# Patient Record
Sex: Female | Born: 2000 | Race: Black or African American | Hispanic: No | Marital: Single | Smoking: Former smoker
Health system: Southern US, Community
[De-identification: ages and names within clinical notes are randomized; demographics above are authoritative.]

## PROBLEM LIST (undated history)

## (undated) ENCOUNTER — Inpatient Hospital Stay (HOSPITAL_COMMUNITY): Payer: Self-pay

## (undated) DIAGNOSIS — F410 Panic disorder [episodic paroxysmal anxiety] without agoraphobia: Secondary | ICD-10-CM

## (undated) DIAGNOSIS — J45909 Unspecified asthma, uncomplicated: Secondary | ICD-10-CM

## (undated) HISTORY — PX: NO PAST SURGERIES: SHX2092

---
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
## 2018-10-07 ENCOUNTER — Emergency Department (HOSPITAL_COMMUNITY)
Admission: EM | Admit: 2018-10-07 | Discharge: 2018-10-07 | Disposition: A | Discharge status: Home / Self Care | Payer: Self-pay | Attending: Emergency Medicine | Admitting: Emergency Medicine

## 2018-10-07 ENCOUNTER — Encounter (HOSPITAL_COMMUNITY): Payer: Self-pay | Admitting: Emergency Medicine

## 2018-10-07 DIAGNOSIS — R195 Other fecal abnormalities: Secondary | ICD-10-CM | POA: Insufficient documentation

## 2018-10-07 DIAGNOSIS — M546 Pain in thoracic spine: Secondary | ICD-10-CM | POA: Insufficient documentation

## 2018-10-07 DIAGNOSIS — M549 Dorsalgia, unspecified: Secondary | ICD-10-CM

## 2018-10-07 DIAGNOSIS — R1084 Generalized abdominal pain: Secondary | ICD-10-CM | POA: Insufficient documentation

## 2018-10-07 DIAGNOSIS — M545 Low back pain: Secondary | ICD-10-CM | POA: Insufficient documentation

## 2018-10-07 LAB — COMPREHENSIVE METABOLIC PANEL
ALBUMIN: 4 g/dL (ref 3.5–5.0)
ALT: 14 U/L (ref 0–44)
AST: 21 U/L (ref 15–41)
Alkaline Phosphatase: 38 U/L (ref 38–126)
Anion gap: 7 (ref 5–15)
BILIRUBIN TOTAL: 0.4 mg/dL (ref 0.3–1.2)
BUN: 12 mg/dL (ref 6–20)
CO2: 24 mmol/L (ref 22–32)
CREATININE: 0.63 mg/dL (ref 0.44–1.00)
Calcium: 8.8 mg/dL — ABNORMAL LOW (ref 8.9–10.3)
Chloride: 107 mmol/L (ref 98–111)
GFR calc Af Amer: >60 mL/min (ref >60)
GLUCOSE: 95 mg/dL (ref 70–99)
Potassium: 4 mmol/L (ref 3.5–5.1)
Sodium: 138 mmol/L (ref 135–145)
TOTAL PROTEIN: 7.1 g/dL (ref 6.5–8.1)

## 2018-10-07 LAB — CBC
HEMATOCRIT: 40.5 % (ref 36.0–46.0)
Hemoglobin: 13.4 g/dL (ref 12.0–15.0)
MCH: 31 pg (ref 26.0–34.0)
MCHC: 33.1 g/dL (ref 30.0–36.0)
MCV: 93.8 fL (ref 80.0–100.0)
Platelets: 306 10*3/uL (ref 150–400)
RBC: 4.32 MIL/uL (ref 3.87–5.11)
RDW: 12.5 % (ref 11.5–15.5)
WBC: 5.6 10*3/uL (ref 4.0–10.5)
nRBC: 0 % (ref 0.0–0.2)

## 2018-10-07 LAB — URINALYSIS, ROUTINE W REFLEX MICROSCOPIC
BILIRUBIN URINE: NEGATIVE
Glucose, UA: NEGATIVE mg/dL
HGB URINE DIPSTICK: NEGATIVE
KETONES UR: NEGATIVE mg/dL
Leukocytes, UA: NEGATIVE
Nitrite: NEGATIVE
PH: 6 (ref 5.0–8.0)
Protein, ur: NEGATIVE mg/dL
SPECIFIC GRAVITY, URINE: 1.011 (ref 1.005–1.030)

## 2018-10-07 LAB — I-STAT BETA HCG BLOOD, ED (MC, WL, AP ONLY): I-stat hCG, quantitative: <5 m[IU]/mL (ref <5)

## 2018-10-07 LAB — LIPASE, BLOOD: Lipase: 41 U/L (ref 11–51)

## 2018-10-07 MED ORDER — ACETAMINOPHEN 325 MG PO TABS
650.0000 mg | ORAL_TABLET | Freq: Once | ORAL | Status: AC
  Administered 2018-10-07: 650 mg via ORAL
  Filled 2018-10-07: qty 2

## 2018-10-07 MED ORDER — ALUM & MAG HYDROXIDE-SIMETH 200-200-20 MG/5ML PO SUSP
30.0000 mL | Freq: Once | ORAL | Status: AC
Start: 2018-10-07 — End: 2018-10-07
  Administered 2018-10-07: 30 mL via ORAL
  Filled 2018-10-07: qty 30

## 2018-10-07 NOTE — ED Triage Notes (Signed)
Pt c/o mid to lower back pain for about 3 weeks. 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  Pt also c/o abd pains for the past week.

## 2018-10-07 NOTE — ED Provider Notes (Signed)
[facility] DEPT Provider Note   CSN: [id number] Arrival date & time: 10/07/18  1302     History   Chief Complaint Chief Complaint  Patient presents with  . back pain  . Abdominal Pain  . Diarrhea    HPI Carolyn Vaughn is a 17 y.o. female who presents with cc of back and abdominal pain.  1) the patient states that 3 weeks ago she began having upper and lower back pain. The pain is worse when she is lying down and she has had significant discomfort at night and difficulty sleeping. It is not worsened with movement. She feels better when she is up and moving around at work.  The pain does not radiate. She has not tried anything to improve her pain. She recently went on a car trip to [employer] weekend and felt that her back was much worse after sitting in the car for several hours.  2) The patient has had 5 days of abdominal pain. The pain is in the upper and lower abdomen. She states that at times her pain feels like her stomach is being twisted.  It does not radiate.  It comes in waves.  She states that during the day she does not seem to notice it as much however recently over the past 2 days she has had worsening epigastric pain.  She is also noticed she is had some gas, belching and in the morning has flatulence and several loose stools before she can get out the door.  She states that it is very abnormal for her.  She denies fevers, chills, urinary symptoms.  She has no previous history of abdominal surgeries.  She denies any change in her appetite.  Her abdominal pain is worse in the morning and at night when she is not working.  She denies polyuria, polydipsia, polyphagia.  She denies fevers, unexplained weight loss or family history of inflammatory bowel disease.  HPI  History reviewed. No pertinent past medical history.  Patient Active Problem List   Diagnosis Date Noted  . Chlamydia infection affecting pregnancy 01/08/2017    Past  Surgical History:  Procedure Laterality Date  . NO PAST SURGERIES       OB History    Gravida  1   Para      Term      Preterm      AB  1   Living        SAB      TAB  1   Ectopic      Multiple      Live Births               Home Medications    Prior to Admission medications   Medication Sig Start Date End Date Taking? Authorizing Provider  ibuprofen (ADVIL,MOTRIN) 600 MG tablet Take 1 tablet (600 mg total) by mouth every 6 (six) hours as needed. Patient not taking: Reported on 10/07/2018 06/18/17   Ward, Ozella Almond, PA-C    Family History Family History  Problem Relation Age of Onset  . Diabetes Maternal Grandmother     Social History Social History   Tobacco Use  . Smoking status: Never Smoker  . Smokeless tobacco: Never Used  Substance Use Topics  . Alcohol use: No  . Drug use: No     Allergies   Patient has no known allergies.   Review of Systems Review of Systems Ten systems reviewed and are negative for acute  change, except as noted in the HPI.    Physical Exam Updated Vital Signs BP 112/89 (BP Location: Left Arm)   Pulse 87   Temp 98.7 F (37.1 C) (Oral)   Resp 17   LMP 09/13/2018   SpO2 100%   Physical Exam  Constitutional: She is oriented to person, place, and time. She appears well-developed and well-nourished. No distress.  HENT:  Head: Normocephalic and atraumatic.  Eyes: Pupils are equal, round, and reactive to light. Conjunctivae and EOM are normal. No scleral icterus.  Neck: Normal range of motion.  Cardiovascular: Normal rate, regular rhythm and normal heart sounds. Exam reveals no gallop and no friction rub.  No murmur heard. Pulmonary/Chest: Effort normal and breath sounds normal. No respiratory distress.  Abdominal: Soft. Bowel sounds are normal. She exhibits no distension and no mass. There is generalized tenderness. There is no guarding.  Musculoskeletal:       Cervical back: She exhibits tenderness. She  exhibits normal range of motion.       Thoracic back: Normal. She exhibits normal range of motion.       Lumbar back: Normal.  Neurological: She is alert and oriented to person, place, and time.  Skin: Skin is warm and dry. She is not diaphoretic.  Psychiatric: Her behavior is normal.  Nursing note and vitals reviewed.    ED Treatments / Results  Labs (all labs ordered are listed, but only abnormal results are displayed) Labs Reviewed  LIPASE, BLOOD  COMPREHENSIVE METABOLIC PANEL  CBC  URINALYSIS, ROUTINE W REFLEX MICROSCOPIC  I-STAT BETA HCG BLOOD, ED ([facility], [facility], [facility] ONLY)    EKG None  Radiology No results found.  Procedures Procedures (including critical care time)  Medications Ordered in ED Medications - No data to display   Initial Impression / Assessment and Plan / ED Course  I have reviewed the triage vital signs and the nursing notes.  Pertinent labs & imaging results that were available during my care of the patient were reviewed by me and considered in my medical decision making (see chart for details).  Clinical Course as of Oct 07 1413  Fri Oct 07, 2018  1410 I-stat hCG, quantitative: <5.0 [AH]    Clinical Course User Index [AH] Margarita Mail, PA-C    This is a 17 year old female with what appears to be musculoskeletal back pain, and abdominal pain.  Personally reviewed the patient's labs which show no acute abnormalities.  She does not have an elevated white blood cell count.  She has normal liver enzymes.  Her kidney function appears normal.  Her lipase is not elevated.  She does not appear to have a urinary tract infection.  The patient has not taken anything for her symptoms.  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  She is of small stature and states that she frequently has a step on her tiptoes to reach objects way above her head and I suspect that her back pain is secondary to using muscle she has not had to use to stabilize herself in a  long time.  Also her pain appears to be improved when she is moving.  Thinks is secondary to the endorphin release during activity.  Patient will should be treated with Tylenol.  She has no red flag symptoms including weakness, numbness, saddle anesthesia, bowel or bladder incontinence.  Patient also has abdominal discomfort, gas and loose stools.  Feel this may be treated with over-the-counter dyspeptic agents such as Maalox, simethicone  or Pepcid.  She may also try Pepto-Bismol which should cover the majority of those symptoms.  Patient does not have any concern for infectious or inflammatory loose stools, she does not appear to have any melena per history.  Feel she is appropriate for discharge at this time with stable vital signs.  She is afebrile.  I discussed outpatient follow-up and return precautions.  Final Clinical Impressions(s) / ED Diagnoses   Final diagnoses:  None    ED Discharge Orders    None       Margarita Mail, PA-C 10/07/18 2138    Valarie Merino, MD 10/20/18 [id number]

## 2018-10-07 NOTE — Discharge Instructions (Addendum)
Your lab work is negative for any abnormality today.  It does not appear that there is an emergent cause of your back or belly pain. Please take tylenol or advil if you are having discomfort, especially at night if you are having difficulty sleeping.  This is likely secondary to your new job, standing and reaching up overhead frequently.  Musculoskeletal pain is generally improved when you are up and working because your body releases endorphins with movement which is your body's way of treating pain.  Is also likely more noticeable at night because you are up and moving.  Secondarily if you are continue to have loose stools or belly pain I would suggest using Pepto-Bismol once a day until your stools firm up.  Noticed that he should wash her mouth out or pressure tongue because he can turn both your tongue and your bowel movements black.  Also you may take Maalox or simethicone which is available over-the-counter for gas.  You are continuing to have symptoms that are uncontrolled with your medications, change in your diet and follow-up with a primary care physician as soon as possible.\  Return to the Emergency department for the reasons listed below: New numbness, tingling, weakness, or problem with the use of your arms or legs.  Severe back pain not relieved with medications.  Change in bowel or bladder control.  Increasing pain in any areas of the body (such as chest or abdominal pain).  Shortness of breath, dizziness or fainting.  Nausea (feeling sick to your stomach), vomiting, fever, or sweats. Your pain becomes severe.  You have a fever  above 101 develops.  Repeated vomiting occurs (multiple episodes).  The pain becomes localized to portions of the abdomen. The right side could possibly be appendicitis. In an adult, the left lower portion of the abdomen could be colitis or diverticulitis.  Blood is being passed in stools or vomit (bright red or black tarry stools).  Return also if you  develop chest pain, difficulty breathing, dizziness or fainting, or become confused, poorly responsive, or inconsolable (young children).

## 2018-11-16 NOTE — L&D Delivery Note (Addendum)
OB/GYN Faculty Practice Delivery Note  Carolyn Vaughn is a 18 y.o. G2P0010 s/p SVD at [redacted]w[redacted]d. She was admitted for IOL secondary gestational HTN.   ROM: 0h 61m with clear fluid GBS Status:  Negative (08/11 0910) Maximum Maternal Temperature: 36.8C  Labor Progress: . Patient presented to L&D for Pt presented to MAU for evaluation of LOF; amnisure & fern were negative. While in MAU had consistently elevated BPs (139-154/89-106). No history of hypertension. Denies headache, visual disturbance, or epigastric pain. PEC labs negative. Initial SVE: 1/80%/0. Patient was started on Pitocin, received epidural and had SROM. She then progressed to complete.   Delivery Date/Time: 07/05/2019 1709 Weight: 2740 g Delivery: Called to room and patient was complete and pushing. Head delivered in ROA position. Nuchal cord present and reduced. Shoulder and body delivered in usual fashion. Infant with spontaneous cry, placed on mother's abdomen, dried and stimulated. Cord clamped x 2 after 1-minute delay, and cut by Orland Penman (PA student). Cord blood drawn. Placenta delivered spontaneously with gentle cord traction. Fundus firm with massage and Pitocin. Labia, perineum, vagina, and cervix inspected inspected with no lacerations.   Placenta: sent to L&D Complications: none Lacerations: none EBL: 350 mL Analgesia: Epidural    Postpartum Planning [ ]  message to sent to schedule follow-up  [ ]  vaccines UTD  Infant: APGAR (1 MIN):   APGAR (5 MINS): 9   APGAR (10 MINS):     Barrington Ellison, MD [facility] Family Medicine Fellow, [facility] for [facility], [facility] 07/05/2019, 6:46 PM

## 2018-11-22 ENCOUNTER — Other Ambulatory Visit: Payer: Self-pay

## 2018-11-22 ENCOUNTER — Emergency Department (HOSPITAL_COMMUNITY)
Admission: EM | Admit: 2018-11-22 | Discharge: 2018-11-22 | Disposition: A | Discharge status: Home / Self Care | Payer: Medicaid Other | Attending: Emergency Medicine | Admitting: Emergency Medicine

## 2018-11-22 ENCOUNTER — Encounter (HOSPITAL_COMMUNITY): Payer: Self-pay

## 2018-11-22 DIAGNOSIS — J45901 Unspecified asthma with (acute) exacerbation: Secondary | ICD-10-CM | POA: Insufficient documentation

## 2018-11-22 MED ORDER — PREDNISONE 20 MG PO TABS
60.0000 mg | ORAL_TABLET | Freq: Once | ORAL | Status: AC
  Administered 2018-11-22: 60 mg via ORAL
  Filled 2018-11-22: qty 3

## 2018-11-22 MED ORDER — ALBUTEROL SULFATE (2.5 MG/3ML) 0.083% IN NEBU
5.0000 mg | INHALATION_SOLUTION | Freq: Once | RESPIRATORY_TRACT | Status: AC
  Administered 2018-11-22: 5 mg via RESPIRATORY_TRACT
  Filled 2018-11-22: qty 6

## 2018-11-22 MED ORDER — ALBUTEROL SULFATE HFA 108 (90 BASE) MCG/ACT IN AERS
2.0000 | INHALATION_SPRAY | Freq: Once | RESPIRATORY_TRACT | Status: AC
  Administered 2018-11-22: 2 via RESPIRATORY_TRACT
  Filled 2018-11-22: qty 6.7

## 2018-11-22 NOTE — ED Provider Notes (Signed)
[facility] DEPT Provider Note   CSN: [id number] Arrival date & time: 11/22/18  1235     History   Chief Complaint Chief Complaint  Patient presents with  . Asthma    HPI Carolyn Vaughn is a 18 y.o. female with past medical history of asthma, presenting to the emergency department after asthma attack.  Patient states she was riding in the car and having a conversation, when she had a sudden asthma attack.  She states she did not have her an inhaler with her, and had a drive to get it, about 8 minutes.  Symptoms improved significantly with albuterol, however patient has a mild lingering headache and some chest wall soreness following the incident.  Pt is out of her albuterol inhaler.  No cold symptoms.  No other complaints.  The history is provided by the patient.    Past Medical History:  Diagnosis Date  . Asthma     Patient Active Problem List   Diagnosis Date Noted  . Chlamydia infection affecting pregnancy 01/08/2017    Past Surgical History:  Procedure Laterality Date  . NO PAST SURGERIES       OB History    Gravida  1   Para      Term      Preterm      AB  1   Living        SAB      TAB  1   Ectopic      Multiple      Live Births               Home Medications    Prior to Admission medications   Medication Sig Start Date End Date Taking? Authorizing Provider  ibuprofen (ADVIL,MOTRIN) 600 MG tablet Take 1 tablet (600 mg total) by mouth every 6 (six) hours as needed. Patient not taking: Reported on 10/07/2018 06/18/17   Ward, Ozella Almond, PA-C    Family History Family History  Problem Relation Age of Onset  . Diabetes Maternal Grandmother   . Emphysema Mother     Social History Social History   Tobacco Use  . Smoking status: Current Every Day Smoker    Types: Cigars  . Smokeless tobacco: Never Used  Substance Use Topics  . Alcohol use: No  . Drug use: No     Allergies   Patient  has no known allergies.   Review of Systems Review of Systems  Constitutional: Negative for fever.  HENT: Negative for congestion and sore throat.   Respiratory: Positive for chest tightness and shortness of breath.      Physical Exam Updated Vital Signs BP 115/72 (BP Location: Right Arm)   Pulse 88   Temp 98.6 F (37 C) (Oral)   Resp 18   Ht 5\' 1"  (1.549 m)   Wt 45.4 kg   SpO2 100%   BMI 18.89 kg/m   Physical Exam Vitals signs and nursing note reviewed.  Constitutional:      General: She is not in acute distress.    Appearance: She is well-developed. She is not ill-appearing.  HENT:     Head: Normocephalic and atraumatic.     Mouth/Throat:     Mouth: Mucous membranes are moist.     Pharynx: Oropharynx is clear.  Eyes:     Conjunctiva/sclera: Conjunctivae normal.  Neck:     Musculoskeletal: Normal range of motion and neck supple.  Cardiovascular:     Rate and Rhythm:  Normal rate and regular rhythm.  Pulmonary:     Effort: Pulmonary effort is normal. No respiratory distress.     Breath sounds: Normal breath sounds. No wheezing.  Neurological:     Mental Status: She is alert.  Psychiatric:        Mood and Affect: Mood normal.        Behavior: Behavior normal.      ED Treatments / Results  Labs (all labs ordered are listed, but only abnormal results are displayed) Labs Reviewed - No data to display  EKG None  Radiology No results found.  Procedures Procedures (including critical care time)  Medications Ordered in ED Medications  predniSONE (DELTASONE) tablet 60 mg (60 mg Oral Given 11/22/18 1434)  albuterol (PROVENTIL) (2.5 MG/3ML) 0.083% nebulizer solution 5 mg (5 mg Nebulization Given 11/22/18 1416)  albuterol (PROVENTIL HFA;VENTOLIN HFA) 108 (90 Base) MCG/ACT inhaler 2 puff (2 puffs Inhalation Given 11/22/18 1417)     Initial Impression / Assessment and Plan / ED Course  I have reviewed the triage vital signs and the nursing notes.  Pertinent  labs & imaging results that were available during my care of the patient were reviewed by me and considered in my medical decision making (see chart for details).     Patient presenting after asthma attack, with symptoms significantly improved on arrival after home albuterol inhaler.  No current signs of respiratory distress. Exam is reassuring, lungs CTAB, however pt requesting neb treatment. Pt states they are breathing at baseline prior to discharge. Pt has been instructed to continue using prescribed medications and to speak with PCP about today's visit.   Discussed results, findings, treatment and follow up. Patient advised of return precautions. Patient verbalized understanding and agreed with plan.   Final Clinical Impressions(s) / ED Diagnoses   Final diagnoses:  Mild asthma with exacerbation, unspecified whether persistent    ED Discharge Orders    None       Robinson, [place] N, PA-C 11/22/18 1530    Blanchie Dessert, MD 11/22/18 1559

## 2018-11-22 NOTE — ED Triage Notes (Addendum)
Patient reports that she began having an asthma attack while riding in the car and did not have her albuterol inhaler with her and when she returned home she used it and states she is feeling much better. patient also c/o headache and left rib cage pain. Lungs clear and Sats 100% in triage.

## 2018-11-22 NOTE — Discharge Instructions (Signed)
Please read instructions below. Use your albuterol inhaler every 4-6 hours as needed for shortness of breath or wheezing.  Follow up with your primary care provider. Return to the ER if you have shortness of breath not improved by your inhaler, or new or concerning symptoms.

## 2018-12-04 ENCOUNTER — Inpatient Hospital Stay (HOSPITAL_COMMUNITY)
Admission: AD | Admit: 2018-12-04 | Discharge: 2018-12-04 | Discharge status: Against Medical Advice | Payer: Medicaid Other | Source: Ambulatory Visit | Attending: Obstetrics & Gynecology | Admitting: Obstetrics & Gynecology

## 2018-12-04 NOTE — MAU Note (Signed)
Pt came up to the desk upset that she hasn't been seen yet. Here for pregnancy test, denies bleeding and abdominal pain. Provider told patient that she could make an appointment at the clinic in the morning or walk in to be seen after 8 am tomorrow or she could continue to wait to be seen. She will come the the office in the morning.

## 2019-01-11 ENCOUNTER — Encounter (HOSPITAL_COMMUNITY): Payer: Self-pay | Admitting: *Deleted

## 2019-01-11 ENCOUNTER — Inpatient Hospital Stay (HOSPITAL_COMMUNITY)
Admission: AD | Admit: 2019-01-11 | Discharge: 2019-01-11 | Disposition: A | Discharge status: Home / Self Care | Payer: Medicaid Other | Attending: Obstetrics & Gynecology | Admitting: Obstetrics & Gynecology

## 2019-01-11 DIAGNOSIS — Z836 Family history of other diseases of the respiratory system: Secondary | ICD-10-CM | POA: Diagnosis not present

## 2019-01-11 DIAGNOSIS — Z87891 Personal history of nicotine dependence: Secondary | ICD-10-CM | POA: Insufficient documentation

## 2019-01-11 DIAGNOSIS — O99511 Diseases of the respiratory system complicating pregnancy, first trimester: Secondary | ICD-10-CM | POA: Diagnosis not present

## 2019-01-11 DIAGNOSIS — W19XXXA Unspecified fall, initial encounter: Secondary | ICD-10-CM | POA: Diagnosis not present

## 2019-01-11 DIAGNOSIS — Z833 Family history of diabetes mellitus: Secondary | ICD-10-CM | POA: Insufficient documentation

## 2019-01-11 DIAGNOSIS — O9989 Other specified diseases and conditions complicating pregnancy, childbirth and the puerperium: Secondary | ICD-10-CM | POA: Diagnosis not present

## 2019-01-11 DIAGNOSIS — H9209 Otalgia, unspecified ear: Secondary | ICD-10-CM | POA: Diagnosis not present

## 2019-01-11 DIAGNOSIS — M542 Cervicalgia: Secondary | ICD-10-CM | POA: Diagnosis present

## 2019-01-11 DIAGNOSIS — M549 Dorsalgia, unspecified: Secondary | ICD-10-CM | POA: Insufficient documentation

## 2019-01-11 DIAGNOSIS — W540XXA Bitten by dog, initial encounter: Secondary | ICD-10-CM | POA: Diagnosis not present

## 2019-01-11 DIAGNOSIS — Z3A13 13 weeks gestation of pregnancy: Secondary | ICD-10-CM | POA: Insufficient documentation

## 2019-01-11 DIAGNOSIS — J45909 Unspecified asthma, uncomplicated: Secondary | ICD-10-CM | POA: Insufficient documentation

## 2019-01-11 LAB — URINALYSIS, ROUTINE W REFLEX MICROSCOPIC
Bilirubin Urine: NEGATIVE
Glucose, UA: NEGATIVE mg/dL
Hgb urine dipstick: NEGATIVE
Ketones, ur: NEGATIVE mg/dL
Leukocytes,Ua: NEGATIVE
NITRITE: NEGATIVE
PH: 8 (ref 5.0–8.0)
Protein, ur: NEGATIVE mg/dL
Specific Gravity, Urine: 1.011 (ref 1.005–1.030)

## 2019-01-11 NOTE — Progress Notes (Signed)
Pt instructed by H. Norman Herrlich, CNM she should be seen in [facility] ED for dog attack d/t complaint not pregnancy related.  Pt has decided she prefers to leave.  Informed she would be leaving AMA since decliningto be seen in [facility] ED.  Pt verbalized understanding, will leave AMA.

## 2019-01-11 NOTE — MAU Note (Signed)
Pt had dog attack on Saturday, was bitten on neck.  Has neck & ear pain, also back pain (entire back).  Has had lower abdominal cramping since Monday, denies bleeding.

## 2019-01-11 NOTE — MAU Provider Note (Signed)
History     CSN: [id number]  Arrival date and time: 01/11/19 1027   First Provider Initiated Contact with Patient 01/11/19 1637      Chief Complaint  Patient presents with  . Neck Pain  . Otalgia  . Back Pain   Carolyn Vaughn is a 18 y.o. G2P0010 at [redacted]w[redacted]d who presents today with back pain and neck pain. She states that on 01/07/19 her mother's dog jumped on her, scratched her neck, pushed her down and she fell onto her back. She states that since that time she has had back pain and neck pain. She reports that she has been taking ibuprofen for this, but it has not helped. She denies any OB related complaint, denies VB, vaginal discharge or cramping. 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 She thinks that she is working too much, and this is contributing to her back pain.    OB History    Gravida  2   Para      Term      Preterm      AB  1   Living        SAB      TAB  1   Ectopic      Multiple      Live Births              Past Medical History:  Diagnosis Date  . Asthma     Past Surgical History:  Procedure Laterality Date  . NO PAST SURGERIES      Family History  Problem Relation Age of Onset  . Diabetes Maternal Grandmother   . Hyperlipidemia Maternal Grandmother   . Emphysema Mother   . Diabetes Brother   . Hyperlipidemia Maternal Aunt   . Diabetes Maternal Aunt   . Diabetes Paternal Grandmother     Social History   Tobacco Use  . Smoking status: Former Smoker    Types: Cigars    Last attempt to quit: 12/05/2018    Years since quitting: 0.1  . Smokeless tobacco: Never Used  Substance Use Topics  . Alcohol use: No  . Drug use: No    Allergies: No Known Allergies  Medications Prior to Admission  Medication Sig Dispense Refill Last Dose  . ibuprofen (ADVIL,MOTRIN) 600 MG tablet Take 1 tablet (600 mg total) by mouth every 6 (six) hours as needed. (Patient not taking: Reported on 10/07/2018) 30 tablet 0 Not Taking at  Unknown time    Review of Systems  Constitutional: Negative for chills and fever.  Gastrointestinal: Negative for abdominal pain.  Genitourinary: Negative for pelvic pain, vaginal bleeding and vaginal discharge.  Musculoskeletal: Positive for back pain and neck pain.   Physical Exam   Blood pressure 114/62, pulse 89, temperature 98.2 F (36.8 C), temperature source Oral, resp. rate 16, height 5\' 1"  (1.549 m), weight 45.3 kg, last menstrual period 10/09/2018, unknown if currently breastfeeding.  Physical Exam  Nursing note and vitals reviewed. Constitutional: She is oriented to person, place, and time. She appears well-developed and well-nourished. No distress.  HENT:  Head: Normocephalic.  Cardiovascular: Normal rate.  GI: Soft. There is no abdominal tenderness. There is no rebound.  Neurological: She is alert and oriented to person, place, and time.  Skin: Skin is warm and dry.  Psychiatric: She has a normal mood and affect.     +FHT 158 with doppler   MAU Course  Procedures  MDM Patient is cleared obstetrically, and no obstetrical  emergency present at this time. Due to fall and dog bite will transfer patient to the ED for medical evaluation.    Patient refuses transfer to ED. Will sign out AMA  Assessment and Plan   1. Fall, initial encounter   2. Dog bite, initial encounter   3. [redacted] weeks gestation of pregnancy    Transfer to the ED for medical evaluation  Patient signed out [place], CNM  01/11/19  4:46 PM

## 2019-01-11 NOTE — Progress Notes (Signed)
Pt left AMA °

## 2019-02-08 ENCOUNTER — Other Ambulatory Visit: Payer: Self-pay

## 2019-02-08 ENCOUNTER — Encounter: Payer: Self-pay | Admitting: Obstetrics

## 2019-02-08 ENCOUNTER — Ambulatory Visit (INDEPENDENT_AMBULATORY_CARE_PROVIDER_SITE_OTHER): Payer: Medicaid Other | Admitting: Obstetrics

## 2019-02-08 ENCOUNTER — Other Ambulatory Visit (HOSPITAL_COMMUNITY)
Admission: RE | Admit: 2019-02-08 | Discharge: 2019-02-08 | Disposition: A | Discharge status: Home / Self Care | Payer: Medicaid Other | Source: Ambulatory Visit | Attending: Obstetrics | Admitting: Obstetrics

## 2019-02-08 VITALS — BP 106/71 | HR 92 | Wt 107.0 lb

## 2019-02-08 DIAGNOSIS — Z3480 Encounter for supervision of other normal pregnancy, unspecified trimester: Secondary | ICD-10-CM | POA: Diagnosis present

## 2019-02-08 DIAGNOSIS — Z3A17 17 weeks gestation of pregnancy: Secondary | ICD-10-CM

## 2019-02-08 DIAGNOSIS — Z3482 Encounter for supervision of other normal pregnancy, second trimester: Secondary | ICD-10-CM | POA: Diagnosis not present

## 2019-02-08 MED ORDER — PRENATE MINI 29-0.6-0.4-350 MG PO CAPS: 1.0000 | ORAL_CAPSULE | Freq: Every day | ORAL | 4 refills | Status: DC

## 2019-02-08 NOTE — Progress Notes (Signed)
Subjective:    Carolyn Vaughn is being seen today for her first obstetrical visit.  This is not a planned pregnancy. She is at [redacted]w[redacted]d gestation. Her obstetrical history is significant for none. Relationship with FOB: significant other, not living together. Patient does intend to breast feed. Pregnancy history fully reviewed.  The information documented in the HPI was reviewed and verified.  Menstrual History: OB History    Gravida  2   Para      Term      Preterm      AB  1   Living        SAB      TAB  1   Ectopic      Multiple      Live Births               Patient's last menstrual period was 10/09/2018.    Past Medical History:  Diagnosis Date  . Asthma     Past Surgical History:  Procedure Laterality Date  . NO PAST SURGERIES      (Not in a hospital admission)  No Known Allergies  Social History   Tobacco Use  . Smoking status: Former Smoker    Types: Cigars    Last attempt to quit: 12/05/2018    Years since quitting: 0.1  . Smokeless tobacco: Never Used  Substance Use Topics  . Alcohol use: No    Family History  Problem Relation Age of Onset  . Diabetes Maternal Grandmother   . Hyperlipidemia Maternal Grandmother   . Emphysema Mother   . Diabetes Brother   . Hyperlipidemia Maternal Aunt   . Diabetes Maternal Aunt   . Diabetes Paternal Grandmother      Review of Systems Constitutional: negative for weight loss Gastrointestinal: negative for vomiting Genitourinary:negative for genital lesions and vaginal discharge and dysuria Musculoskeletal:negative for back pain Behavioral/Psych: negative for abusive relationship, depression, illegal drug usage and tobacco use    Objective:    BP 106/71   Pulse 92   Wt 107 lb (48.5 kg)   LMP 10/09/2018   BMI 20.22 kg/m  General Appearance:    Alert, cooperative, no distress, appears stated age  Head:    Normocephalic, without obvious abnormality, atraumatic  Eyes:    PERRL,  conjunctiva/corneas clear, EOM's intact, fundi    benign, both eyes  Ears:    Normal TM's and external ear canals, both ears  Nose:   Nares normal, septum midline, mucosa normal, no drainage    or sinus tenderness  Throat:   Lips, mucosa, and tongue normal; teeth and gums normal  Neck:   Supple, symmetrical, trachea midline, no adenopathy;    thyroid:  no enlargement/tenderness/nodules; no carotid   bruit or JVD  Back:     Symmetric, no curvature, ROM normal, no CVA tenderness  Lungs:     Clear to auscultation bilaterally, respirations unlabored  Chest Wall:    No tenderness or deformity   Heart:    Regular rate and rhythm, S1 and S2 normal, no murmur, rub   or gallop  Breast Exam:    No tenderness, masses, or nipple abnormality  Abdomen:     Soft, non-tender, bowel sounds active all four quadrants,    no masses, no organomegaly  Genitalia:    Normal female without lesion, discharge or tenderness  Extremities:   Extremities normal, atraumatic, no cyanosis or edema  Pulses:   2+ and symmetric all extremities  Skin:   Skin  color, texture, turgor normal, no rashes or lesions  Lymph nodes:   Cervical, supraclavicular, and axillary nodes normal  Neurologic:   CNII-XII intact, normal strength, sensation and reflexes    throughout      Lab Review Urine pregnancy test Labs reviewed yes Radiologic studies reviewed no Assessment:    Pregnancy at [redacted]w[redacted]d weeks    Plan:     1. Supervision of other normal pregnancy, antepartum Rx: - Obstetric Panel, Including HIV - Genetic Screening - Culture, OB Urine - AFP, Serum, Open Spina Bifida - Cervicovaginal ancillary only( [place]) - [place] MFM OB COMP + 107 WK; Future - Enroll Patient in Babyscripts - Babyscripts Schedule Optimization - Prenat w/o A-FeCbn-Meth-FA-DHA (PRENATE MINI) 29-0.6-0.4-350 MG CAPS; Take 1 capsule by mouth daily before breakfast.  Dispense: 90 capsule; Refill: 4  Prenatal vitamins.  Counseling provided regarding  continued use of seat belts, cessation of alcohol consumption, smoking or use of illicit drugs; infection precautions i.e., influenza/TDAP immunizations, toxoplasmosis,CMV, parvovirus, listeria and varicella; workplace safety, exercise during pregnancy; routine dental care, safe medications, sexual activity, hot tubs, saunas, pools, travel, caffeine use, fish and methlymercury, potential toxins, hair treatments, varicose veins Weight gain recommendations per IOM guidelines reviewed: underweight/BMI< 18.5--> gain 28 - 40 lbs; normal weight/BMI 18.5 - 24.9--> gain 25 - 35 lbs; overweight/BMI 25 - 29.9--> gain 15 - 25 lbs; obese/BMI >30->gain  11 - 20 lbs Problem list reviewed and updated. FIRST/CF mutation testing/NIPT/QUAD SCREEN/fragile X/Ashkenazi Jewish population testing/Spinal muscular atrophy discussed: requested. Role of ultrasound in pregnancy discussed; fetal survey: requested. Amniocentesis discussed: not indicated.   Meds ordered this encounter  Medications  . Prenat w/o A-FeCbn-Meth-FA-DHA (PRENATE MINI) 29-0.6-0.4-350 MG CAPS    Sig: Take 1 capsule by mouth daily before breakfast.    Dispense:  90 capsule    Refill:  4   Orders Placed This Encounter  Procedures  . Culture, OB Urine  . [place] MFM OB COMP + 14 WK    Standing Status:   Future    Standing Expiration Date:   04/09/2020    Order Specific Question:   Reason for Exam (SYMPTOM  OR DIAGNOSIS REQUIRED)    Answer:   Anatomy    Order Specific Question:   Preferred Location    Answer:   [facility]  . Obstetric Panel, Including HIV  . Genetic Screening  . AFP, Serum, Open Spina Bifida    Order Specific Question:   Is patient insulin dependent?    Answer:   No    Order Specific Question:   Weight (lbs)    Answer:   8    Order Specific Question:   Gestational Age (GA), weeks    Answer:   17.3    Order Specific Question:   Date on which patient was at this [place]    Answer:   02/08/2019     Order Specific Question:   GA Calculation Method    Answer:   LMP    Order Specific Question:   Number of fetuses    Answer:   1    Follow up in 4 weeks. 50% of 20 min visit spent on counseling and coordination of care.    Shelly Bombard MD 02-08-2019

## 2019-02-09 ENCOUNTER — Other Ambulatory Visit: Payer: Self-pay | Admitting: Obstetrics

## 2019-02-09 DIAGNOSIS — B9689 Other specified bacterial agents as the cause of diseases classified elsewhere: Secondary | ICD-10-CM

## 2019-02-09 DIAGNOSIS — A749 Chlamydial infection, unspecified: Secondary | ICD-10-CM

## 2019-02-09 DIAGNOSIS — N760 Acute vaginitis: Principal | ICD-10-CM

## 2019-02-09 LAB — CERVICOVAGINAL ANCILLARY ONLY
BACTERIAL VAGINITIS: POSITIVE — AB
CANDIDA VAGINITIS: NEGATIVE
Chlamydia: POSITIVE — AB
Neisseria Gonorrhea: NEGATIVE
TRICH (WINDOWPATH): NEGATIVE

## 2019-02-09 MED ORDER — AZITHROMYCIN 500 MG PO TABS: 1000.0000 mg | ORAL_TABLET | Freq: Once | ORAL | 0 refills | Status: AC

## 2019-02-09 MED ORDER — CEFIXIME 400 MG PO CAPS: 400.0000 mg | ORAL_CAPSULE | Freq: Once | ORAL | 0 refills | Status: AC

## 2019-02-09 MED ORDER — TINIDAZOLE 500 MG PO TABS: 1000.0000 mg | ORAL_TABLET | Freq: Every day | ORAL | 2 refills | Status: DC

## 2019-02-10 LAB — URINE CULTURE, OB REFLEX

## 2019-02-10 LAB — OBSTETRIC PANEL, INCLUDING HIV
ANTIBODY SCREEN: NEGATIVE
BASOS: 0 %
Basophils Absolute: 0 10*3/uL (ref 0.0–0.2)
EOS (ABSOLUTE): 0 10*3/uL (ref 0.0–0.4)
EOS: 1 %
HEMATOCRIT: 33.6 % — AB (ref 34.0–46.6)
HEMOGLOBIN: 11.4 g/dL (ref 11.1–15.9)
HEP B S AG: NEGATIVE
HIV Screen 4th Generation wRfx: NONREACTIVE
IMMATURE GRANS (ABS): 0 10*3/uL (ref 0.0–0.1)
Immature Granulocytes: 0 %
LYMPHS: 24 %
Lymphocytes Absolute: 1.6 10*3/uL (ref 0.7–3.1)
MCH: 30.1 pg (ref 26.6–33.0)
MCHC: 33.9 g/dL (ref 31.5–35.7)
MCV: 89 fL (ref 79–97)
MONOCYTES: 7 %
Monocytes Absolute: 0.5 10*3/uL (ref 0.1–0.9)
NEUTROS ABS: 4.4 10*3/uL (ref 1.4–7.0)
Neutrophils: 68 %
Platelets: 324 10*3/uL (ref 150–450)
RBC: 3.79 x10E6/uL (ref 3.77–5.28)
RDW: 12.1 % (ref 11.7–15.4)
RH TYPE: POSITIVE
RPR: NONREACTIVE
RUBELLA: 4.76 {index} (ref >0.99)
WBC: 6.5 10*3/uL (ref 3.4–10.8)

## 2019-02-10 LAB — AFP, SERUM, OPEN SPINA BIFIDA
AFP MOM: 0.58
AFP Value: 31.8 ng/mL
Gest. Age on Collection Date: 17.3 weeks
Maternal Age At EDD: 20.4 yr
OSBR RISK 1 IN: 10000
TEST RESULTS AFP: NEGATIVE
Weight: 107 [lb_av]

## 2019-02-10 LAB — CULTURE, OB URINE

## 2019-02-14 ENCOUNTER — Encounter: Payer: Self-pay | Admitting: Obstetrics

## 2019-02-20 ENCOUNTER — Other Ambulatory Visit: Payer: Self-pay

## 2019-02-20 ENCOUNTER — Encounter (HOSPITAL_COMMUNITY): Payer: Self-pay

## 2019-02-20 ENCOUNTER — Ambulatory Visit (HOSPITAL_COMMUNITY)
Admission: RE | Admit: 2019-02-20 | Discharge: 2019-02-20 | Disposition: A | Discharge status: Home / Self Care | Payer: Medicaid Other | Source: Ambulatory Visit | Attending: Obstetrics and Gynecology | Admitting: Obstetrics and Gynecology

## 2019-02-20 DIAGNOSIS — Z363 Encounter for antenatal screening for malformations: Secondary | ICD-10-CM | POA: Diagnosis not present

## 2019-02-20 DIAGNOSIS — Z3A19 19 weeks gestation of pregnancy: Secondary | ICD-10-CM

## 2019-02-20 DIAGNOSIS — Z3480 Encounter for supervision of other normal pregnancy, unspecified trimester: Secondary | ICD-10-CM

## 2019-03-08 ENCOUNTER — Encounter: Payer: Medicaid Other | Admitting: Obstetrics

## 2019-03-15 ENCOUNTER — Ambulatory Visit (INDEPENDENT_AMBULATORY_CARE_PROVIDER_SITE_OTHER): Payer: Medicaid Other | Admitting: Obstetrics

## 2019-03-15 ENCOUNTER — Encounter: Payer: Self-pay | Admitting: Obstetrics

## 2019-03-15 ENCOUNTER — Other Ambulatory Visit: Payer: Self-pay

## 2019-03-15 VITALS — BP 129/75

## 2019-03-15 DIAGNOSIS — M549 Dorsalgia, unspecified: Secondary | ICD-10-CM

## 2019-03-15 DIAGNOSIS — G5600 Carpal tunnel syndrome, unspecified upper limb: Secondary | ICD-10-CM

## 2019-03-15 DIAGNOSIS — O26899 Other specified pregnancy related conditions, unspecified trimester: Secondary | ICD-10-CM

## 2019-03-15 DIAGNOSIS — Z3480 Encounter for supervision of other normal pregnancy, unspecified trimester: Secondary | ICD-10-CM

## 2019-03-15 DIAGNOSIS — O26892 Other specified pregnancy related conditions, second trimester: Secondary | ICD-10-CM

## 2019-03-15 DIAGNOSIS — Z3A22 22 weeks gestation of pregnancy: Secondary | ICD-10-CM

## 2019-03-15 MED ORDER — COMFORT FIT MATERNITY SUPP SM MISC: 0 refills | Status: DC

## 2019-03-15 NOTE — Progress Notes (Signed)
   PRENATAL VISIT NOTE TELEHEALTH VIRTUAL OBSTETRICS VISIT ENCOUNTER NOTE  I connected with@ on 03/15/19 at 11:00 AM EDT by Webex at home and verified that I am speaking with the correct person using two identifiers.   I discussed the limitations, risks, security and privacy concerns of performing an evaluation and management service by telephone and the availability of in person appointments. I also discussed with the patient that there may be a patient responsible charge related to this service. The patient expressed understanding and agreed to proceed. Subjective:  Carolyn Vaughn is a 18 y.o. G2P0010 at [redacted]w[redacted]d being seen today for ongoing prenatal care.  She is currently monitored for the following issues for this low-risk pregnancy and has Chlamydia infection affecting pregnancy and Supervision of other normal pregnancy, antepartum on their problem list.  Patient reports backache, carpal tunnel symptoms and heartburn occasionally that is relieved by Tums.  Reports fetal movement. Contractions: Not present. Vag. Bleeding: None.  Movement: Present. Denies any contractions, bleeding or leaking of fluid.   The following portions of the patient's history were reviewed and updated as appropriate: allergies, current medications, past family history, past medical history, past social history, past surgical history and problem list.   Objective:   Vitals:   03/15/19 1111  BP: 129/75    Fetal Status:     Movement: Present     General:  Alert, oriented and cooperative. Patient is in no acute distress.  Respiratory: Normal respiratory effort, no problems with respiration noted  Mental Status: Normal mood and affect. Normal behavior. Normal judgment and thought content.  Rest of physical exam deferred due to type of encounter  Assessment and Plan:  Pregnancy: G2P0010 at [redacted]w[redacted]d 1. Supervision of other normal pregnancy, antepartum  2. Backache symptom Rx: - Elastic Bandages &  Supports (COMFORT FIT MATERNITY SUPP SM) MISC; WEAR AS DIRECTED.  Dispense: 1 each; Refill: 0  3. Carpal tunnel syndrome during pregnancy Rx: - Wrist Splints  Preterm labor symptoms and general obstetric precautions including but not limited to vaginal bleeding, contractions, leaking of fluid and fetal movement were reviewed in detail with the patient. I discussed the assessment and treatment plan with the patient. The patient was provided an opportunity to ask questions and all were answered. The patient agreed with the plan and demonstrated an understanding of the instructions. The patient was advised to call back or seek an in-person office evaluation/go to MAU at [facility] for any urgent or concerning symptoms. Please refer to After Visit Summary for other counseling recommendations.  I provided 10 minutes of non-face-to-face time during this encounter. Return in about 4 weeks (around 04/12/2019) for [facility].  No future appointments.  Baltazar Najjar, MD Center for [facility], [place] [facility] 03-15-2019

## 2019-03-15 NOTE — Progress Notes (Signed)
Pt is on the phone preparing for Webex visit with provider. [redacted]w[redacted]d.

## 2019-03-23 ENCOUNTER — Telehealth: Payer: Self-pay | Admitting: *Deleted

## 2019-03-23 NOTE — Telephone Encounter (Signed)
Pt called to office asking about signs of PTL. Attempt to return call.  No answer, LM on VM to call back.

## 2019-04-12 ENCOUNTER — Ambulatory Visit (INDEPENDENT_AMBULATORY_CARE_PROVIDER_SITE_OTHER): Payer: Medicaid Other | Admitting: Obstetrics

## 2019-04-12 ENCOUNTER — Encounter: Payer: Medicaid Other | Admitting: Obstetrics

## 2019-04-12 ENCOUNTER — Encounter: Payer: Self-pay | Admitting: Obstetrics

## 2019-04-12 ENCOUNTER — Other Ambulatory Visit: Payer: Self-pay

## 2019-04-12 VITALS — BP 118/82

## 2019-04-12 DIAGNOSIS — Z3A26 26 weeks gestation of pregnancy: Secondary | ICD-10-CM

## 2019-04-12 DIAGNOSIS — Z3482 Encounter for supervision of other normal pregnancy, second trimester: Secondary | ICD-10-CM

## 2019-04-12 DIAGNOSIS — Z3480 Encounter for supervision of other normal pregnancy, unspecified trimester: Secondary | ICD-10-CM

## 2019-04-12 NOTE — Addendum Note (Signed)
Addended by: Baltazar Najjar A on: 04/12/2019 12:42 PM   Modules accepted: Orders

## 2019-04-12 NOTE — Progress Notes (Signed)
Pt is on the phone preparing for virtual visit with provider. [redacted]w[redacted]d.

## 2019-04-12 NOTE — Progress Notes (Addendum)
   TELEHEALTH OBSTETRICS PRENATAL VIRTUAL VIDEO VISIT ENCOUNTER NOTE  Provider location: [facility] [place] at [place]   I connected with Wendall Mola on 04/12/19 at 11:15 AM EDT by WebEx OB MyChart Video Encounter at home and verified that I am speaking with the correct person using two identifiers.   I discussed the limitations, risks, security and privacy concerns of performing an evaluation and management service by telephone and the availability of in person appointments. I also discussed with the patient that there may be a patient responsible charge related to this service. The patient expressed understanding and agreed to proceed.  Subjective:  Carolyn Vaughn is a 18 y.o. G2P0010 at [redacted]w[redacted]d being seen today for ongoing prenatal care.  She is currently monitored for the following issues for this low-risk pregnancy and has Chlamydia infection affecting pregnancy and Supervision of other normal pregnancy, antepartum on their problem list.  Patient reports backache.  Contractions: Not present. Vag. Bleeding: None.  Movement: Present. Denies any leaking of fluid.   The following portions of the patient's history were reviewed and updated as appropriate: allergies, current medications, past family history, past medical history, past social history, past surgical history and problem list.   Objective:   Vitals:   04/12/19 1145  BP: 118/82    Fetal Status:     Movement: Present     General:  Alert, oriented and cooperative. Patient is in no acute distress.  Respiratory: Normal respiratory effort, no problems with respiration noted  Mental Status: Normal mood and affect. Normal behavior. Normal judgment and thought content.  Rest of physical exam deferred due to type of encounter  Imaging: No results found.  Assessment and Plan:  Pregnancy: G2P0010 at [redacted]w[redacted]d 1. Supervision of other normal pregnancy, antepartum   Preterm labor symptoms and  general obstetric precautions including but not limited to vaginal bleeding, contractions, leaking of fluid and fetal movement were reviewed in detail with the patient. I discussed the assessment and treatment plan with the patient. The patient was provided an opportunity to ask questions and all were answered. The patient agreed with the plan and demonstrated an understanding of the instructions. The patient was advised to call back or seek an in-person office evaluation/go to MAU at [facility] for any urgent or concerning symptoms. Please refer to After Visit Summary for other counseling recommendations.   I provided 10 minutes of face-to-face time during this encounter.  Return in about 2 weeks (around 04/26/2019) for [place] in office.  , 2 hour OGTT.  No future appointments.  Baltazar Najjar, MD Center for [facility], [place] [facility] 04-12-2019

## 2019-04-26 ENCOUNTER — Other Ambulatory Visit: Payer: Self-pay

## 2019-04-26 ENCOUNTER — Ambulatory Visit (INDEPENDENT_AMBULATORY_CARE_PROVIDER_SITE_OTHER): Payer: Medicaid Other | Admitting: Certified Nurse Midwife

## 2019-04-26 ENCOUNTER — Encounter: Payer: Self-pay | Admitting: Certified Nurse Midwife

## 2019-04-26 ENCOUNTER — Other Ambulatory Visit: Payer: Medicaid Other

## 2019-04-26 VITALS — BP 120/77 | HR 88 | Wt 117.0 lb

## 2019-04-26 DIAGNOSIS — O98813 Other maternal infectious and parasitic diseases complicating pregnancy, third trimester: Secondary | ICD-10-CM

## 2019-04-26 DIAGNOSIS — O98819 Other maternal infectious and parasitic diseases complicating pregnancy, unspecified trimester: Secondary | ICD-10-CM

## 2019-04-26 DIAGNOSIS — Z23 Encounter for immunization: Secondary | ICD-10-CM | POA: Diagnosis not present

## 2019-04-26 DIAGNOSIS — A749 Chlamydial infection, unspecified: Secondary | ICD-10-CM

## 2019-04-26 DIAGNOSIS — Z3480 Encounter for supervision of other normal pregnancy, unspecified trimester: Secondary | ICD-10-CM

## 2019-04-26 NOTE — Patient Instructions (Signed)
Glucose Tolerance Test During Pregnancy Why am I having this test? The glucose tolerance test (GTT) is done to check how your body processes sugar (glucose). This is one of several tests used to diagnose diabetes that develops during pregnancy (gestational diabetes mellitus). Gestational diabetes is a temporary form of diabetes that some women develop during pregnancy. It usually occurs during the second trimester of pregnancy and goes away after delivery. Testing (screening) for gestational diabetes usually occurs between 24 and 28 weeks of pregnancy. You may have the GTT test after having a 1-hour glucose screening test if the results from that test indicate that you may have gestational diabetes. You may also have this test if:  You have a history of gestational diabetes.  You have a history of giving birth to very large babies or have experienced repeated fetal loss (stillbirth).  You have signs and symptoms of diabetes, such as: ? Changes in your vision. ? Tingling or numbness in your hands or feet. ? Changes in hunger, thirst, and urination that are not otherwise explained by your pregnancy. What is being tested? This test measures the amount of glucose in your blood at different times during a period of 3 hours. This indicates how well your body is able to process glucose. What kind of sample is taken?  Blood samples are required for this test. They are usually collected by inserting a needle into a blood vessel. How do I prepare for this test?  For 3 days before your test, eat normally. Have plenty of carbohydrate-rich foods.  Follow instructions from your health care provider about: ? Eating or drinking restrictions on the day of the test. You may be asked to not eat or drink anything other than water (fast) starting 8-10 hours before the test. ? Changing or stopping your regular medicines. Some medicines may interfere with this test. Tell a health care provider about:  All  medicines you are taking, including vitamins, herbs, eye drops, creams, and over-the-counter medicines.  Any blood disorders you have.  Any surgeries you have had.  Any medical conditions you have. What happens during the test? First, your blood glucose will be measured. This is referred to as your fasting blood glucose, since you fasted before the test. Then, you will drink a glucose solution that contains a certain amount of glucose. Your blood glucose will be measured again 1, 2, and 3 hours after drinking the solution. This test takes about 3 hours to complete. You will need to stay at the testing location during this time. During the testing period:  Do not eat or drink anything other than the glucose solution.  Do not exercise.  Do not use any products that contain nicotine or tobacco, such as cigarettes and e-cigarettes. If you need help stopping, ask your health care provider. The testing procedure may vary among health care providers and hospitals. How are the results reported? Your results will be reported as milligrams of glucose per deciliter of blood (mg/dL) or millimoles per liter (mmol/L). Your health care provider will compare your results to normal ranges that were established after testing a large group of people (reference ranges). Reference ranges may vary among labs and hospitals. For this test, common reference ranges are:  Fasting: less than 95-105 mg/dL (5.3-5.8 mmol/L).  1 hour after drinking glucose: less than 180-190 mg/dL (10.0-10.5 mmol/L).  2 hours after drinking glucose: less than 155-165 mg/dL (8.6-9.2 mmol/L).  3 hours after drinking glucose: 140-145 mg/dL (7.8-8.1 mmol/L). What do the   results mean? Results within reference ranges are considered normal, meaning that your glucose levels are well-controlled. If two or more of your blood glucose levels are high, you may be diagnosed with gestational diabetes. If only one level is high, your health care  provider may suggest repeat testing or other tests to confirm a diagnosis. Talk with your health care provider about what your results mean. Questions to ask your health care provider Ask your health care provider, or the department that is doing the test:  When will my results be ready?  How will I get my results?  What are my treatment options?  What other tests do I need?  What are my next steps? Summary  The glucose tolerance test (GTT) is one of several tests used to diagnose diabetes that develops during pregnancy (gestational diabetes mellitus). Gestational diabetes is a temporary form of diabetes that some women develop during pregnancy.  You may have the GTT test after having a 1-hour glucose screening test if the results from that test indicate that you may have gestational diabetes. You may also have this test if you have any symptoms or risk factors for gestational diabetes.  Talk with your health care provider about what your results mean. This information is not intended to replace advice given to you by your health care provider. Make sure you discuss any questions you have with your health care provider. Document Released: 05/03/2012 Document Revised: 06/14/2017 Document Reviewed: 06/14/2017 Elsevier Interactive Patient Education  2019 Elsevier Inc.  

## 2019-04-26 NOTE — Progress Notes (Signed)
   PRENATAL VISIT NOTE  Subjective:  Carolyn Vaughn is a 18 y.o. G2P0010 at [redacted]w[redacted]d being seen today for ongoing prenatal care.  She is currently monitored for the following issues for this low-risk pregnancy and has Chlamydia infection affecting pregnancy; Supervision of other normal pregnancy, antepartum; and Chlamydia infection during pregnancy on their problem list.  Patient reports no complaints.  Contractions: Not present. Vag. Bleeding: None.  Movement: Present. Denies leaking of fluid.   The following portions of the patient's history were reviewed and updated as appropriate: allergies, current medications, past family history, past medical history, past social history, past surgical history and problem list.   Objective:   Vitals:   04/26/19 0840  BP: 120/77  Pulse: 88  Weight: 117 lb (53.1 kg)    Fetal Status: Fetal Heart Rate (bpm): 153 Fundal Height: 25 cm Movement: Present     General:  Alert, oriented and cooperative. Patient is in no acute distress.  Skin: Skin is warm and dry. No rash noted.   Cardiovascular: Normal heart rate noted  Respiratory: Normal respiratory effort, no problems with respiration noted  Abdomen: Soft, gravid, appropriate for gestational age.  Pain/Pressure: Absent     Pelvic: Cervical exam deferred        Extremities: Normal range of motion.  Edema: None  Mental Status: Normal mood and affect. Normal behavior. Normal judgment and thought content.   Assessment and Plan:  Pregnancy: G2P0010 at [redacted]w[redacted]d 1. Supervision of other normal pregnancy, antepartum - Patient doing well, no complaints - Patient not fasting at today's appointment, 1hr GTT ordered and A1C  - Discussed with patient if 1hr GTT abnormal would then need to have 3hr GTT or 4x daily CBG, patient verbalizes understanding  - Routine prenatal care  - Anticipatory guidance on upcoming appointments  - Discussed use of babyscripts app and importance of logging BP into app  weekly, patient verbalizes understanding and reports she will start this week   - CBC - RPR - HIV Antibody (routine testing w rflx) - HgB A1c - Glucose tolerance, 1 hour  2. Need for Tdap vaccination - Tdap vaccine greater than or equal to 7yo IM   Preterm labor symptoms and general obstetric precautions including but not limited to vaginal bleeding, contractions, leaking of fluid and fetal movement were reviewed in detail with the patient. Please refer to After Visit Summary for other counseling recommendations.   Return in about 4 weeks (around 05/24/2019) for ROB-mychart visit .  Future Appointments  Date Time Provider [place]  05/24/2019  2:15 PM Shelly Bombard, MD [place] None    Lajean Manes, CNM

## 2019-04-26 NOTE — Progress Notes (Signed)
ROB with no complaints.  Tdap Given  Pt has cuff at home has not checked B/P at home yet.

## 2019-04-27 LAB — CBC
Hematocrit: 35.4 % (ref 34.0–46.6)
Hemoglobin: 11.9 g/dL (ref 11.1–15.9)
MCH: 30.7 pg (ref 26.6–33.0)
MCHC: 33.6 g/dL (ref 31.5–35.7)
MCV: 92 fL (ref 79–97)
Platelets: 278 10*3/uL (ref 150–450)
RBC: 3.87 x10E6/uL (ref 3.77–5.28)
RDW: 13.2 % (ref 11.7–15.4)
WBC: 7.9 10*3/uL (ref 3.4–10.8)

## 2019-04-27 LAB — GLUCOSE TOLERANCE, 1 HOUR: Glucose, 1Hr PP: 72 mg/dL (ref 65–199)

## 2019-04-27 LAB — HEMOGLOBIN A1C
Est. average glucose Bld gHb Est-mCnc: 105 mg/dL
Hgb A1c MFr Bld: 5.3 % (ref 4.8–5.6)

## 2019-04-27 LAB — RPR: RPR Ser Ql: NONREACTIVE

## 2019-04-27 LAB — HIV ANTIBODY (ROUTINE TESTING W REFLEX): HIV Screen 4th Generation wRfx: NONREACTIVE

## 2019-05-19 ENCOUNTER — Encounter (HOSPITAL_COMMUNITY): Payer: Self-pay

## 2019-05-19 ENCOUNTER — Other Ambulatory Visit: Payer: Self-pay

## 2019-05-19 ENCOUNTER — Inpatient Hospital Stay (HOSPITAL_COMMUNITY)
Admission: AD | Admit: 2019-05-19 | Discharge: 2019-05-19 | Disposition: A | Discharge status: Home / Self Care | Payer: Medicaid Other | Attending: Family Medicine | Admitting: Family Medicine

## 2019-05-19 DIAGNOSIS — O4703 False labor before 37 completed weeks of gestation, third trimester: Secondary | ICD-10-CM

## 2019-05-19 DIAGNOSIS — A568 Sexually transmitted chlamydial infection of other sites: Secondary | ICD-10-CM | POA: Insufficient documentation

## 2019-05-19 DIAGNOSIS — Z3480 Encounter for supervision of other normal pregnancy, unspecified trimester: Secondary | ICD-10-CM

## 2019-05-19 DIAGNOSIS — A749 Chlamydial infection, unspecified: Secondary | ICD-10-CM

## 2019-05-19 DIAGNOSIS — O98813 Other maternal infectious and parasitic diseases complicating pregnancy, third trimester: Secondary | ICD-10-CM | POA: Diagnosis not present

## 2019-05-19 DIAGNOSIS — Z3A31 31 weeks gestation of pregnancy: Secondary | ICD-10-CM | POA: Insufficient documentation

## 2019-05-19 DIAGNOSIS — O98313 Other infections with a predominantly sexual mode of transmission complicating pregnancy, third trimester: Secondary | ICD-10-CM | POA: Insufficient documentation

## 2019-05-19 DIAGNOSIS — Z87891 Personal history of nicotine dependence: Secondary | ICD-10-CM | POA: Diagnosis not present

## 2019-05-19 LAB — WET PREP, GENITAL
Clue Cells Wet Prep HPF POC: NONE SEEN
Sperm: NONE SEEN
Trich, Wet Prep: NONE SEEN
Yeast Wet Prep HPF POC: NONE SEEN

## 2019-05-19 LAB — COMPREHENSIVE METABOLIC PANEL
ALT: 16 U/L (ref 0–44)
AST: 21 U/L (ref 15–41)
Albumin: 2.5 g/dL — ABNORMAL LOW (ref 3.5–5.0)
Alkaline Phosphatase: 62 U/L (ref 38–126)
Anion gap: 8 (ref 5–15)
BUN: <5 mg/dL — ABNORMAL LOW (ref 6–20)
CO2: 20 mmol/L — ABNORMAL LOW (ref 22–32)
Calcium: 8.7 mg/dL — ABNORMAL LOW (ref 8.9–10.3)
Chloride: 106 mmol/L (ref 98–111)
Creatinine, Ser: 0.58 mg/dL (ref 0.44–1.00)
GFR calc Af Amer: >60 mL/min (ref >60)
GFR calc non Af Amer: >60 mL/min (ref >60)
Glucose, Bld: 79 mg/dL (ref 70–99)
Potassium: 3.7 mmol/L (ref 3.5–5.1)
Sodium: 134 mmol/L — ABNORMAL LOW (ref 135–145)
Total Bilirubin: 0.4 mg/dL (ref 0.3–1.2)
Total Protein: 6.2 g/dL — ABNORMAL LOW (ref 6.5–8.1)

## 2019-05-19 LAB — CBC WITH DIFFERENTIAL/PLATELET
Abs Immature Granulocytes: 0.04 10*3/uL (ref 0.00–0.07)
Basophils Absolute: 0 10*3/uL (ref 0.0–0.1)
Basophils Relative: 0 %
Eosinophils Absolute: 0 10*3/uL (ref 0.0–0.5)
Eosinophils Relative: 0 %
HCT: 32.5 % — ABNORMAL LOW (ref 36.0–46.0)
Hemoglobin: 11 g/dL — ABNORMAL LOW (ref 12.0–15.0)
Immature Granulocytes: 1 %
Lymphocytes Relative: 24 %
Lymphs Abs: 1.8 10*3/uL (ref 0.7–4.0)
MCH: 29.8 pg (ref 26.0–34.0)
MCHC: 33.8 g/dL (ref 30.0–36.0)
MCV: 88.1 fL (ref 80.0–100.0)
Monocytes Absolute: 0.7 10*3/uL (ref 0.1–1.0)
Monocytes Relative: 10 %
Neutro Abs: 4.9 10*3/uL (ref 1.7–7.7)
Neutrophils Relative %: 65 %
Platelets: 244 10*3/uL (ref 150–400)
RBC: 3.69 MIL/uL — ABNORMAL LOW (ref 3.87–5.11)
RDW: 12.4 % (ref 11.5–15.5)
WBC: 7.5 10*3/uL (ref 4.0–10.5)
nRBC: 0 % (ref 0.0–0.2)

## 2019-05-19 LAB — PROTEIN / CREATININE RATIO, URINE
Creatinine, Urine: 73.12 mg/dL
Protein Creatinine Ratio: 0.16 mg/mg{Cre} — ABNORMAL HIGH (ref 0.00–0.15)
Total Protein, Urine: 12 mg/dL

## 2019-05-19 LAB — URINALYSIS, ROUTINE W REFLEX MICROSCOPIC
Bilirubin Urine: NEGATIVE
Glucose, UA: NEGATIVE mg/dL
Hgb urine dipstick: NEGATIVE
Ketones, ur: NEGATIVE mg/dL
Leukocytes,Ua: NEGATIVE
Nitrite: NEGATIVE
Protein, ur: NEGATIVE mg/dL
Specific Gravity, Urine: 1.01 (ref 1.005–1.030)
pH: 7 (ref 5.0–8.0)

## 2019-05-19 MED ORDER — NIFEDIPINE 10 MG PO CAPS
10.0000 mg | ORAL_CAPSULE | ORAL | Status: AC
  Administered 2019-05-19 (×3): 10 mg via ORAL
  Filled 2019-05-19 (×3): qty 1

## 2019-05-19 MED ORDER — LACTATED RINGERS IV BOLUS
1000.0000 mL | Freq: Once | INTRAVENOUS | Status: AC
  Administered 2019-05-19: 1000 mL via INTRAVENOUS

## 2019-05-19 MED ORDER — LACTATED RINGERS IV BOLUS
1000.0000 mL | Freq: Once | INTRAVENOUS | Status: AC
  Administered 2019-05-19: 16:00:00 1000 mL via INTRAVENOUS

## 2019-05-19 NOTE — Discharge Instructions (Signed)
Preterm Labor and Birth Information Pregnancy normally lasts 39-41 weeks. Preterm labor is when labor starts early. It starts before you have been pregnant for 37 whole weeks. What are the risk factors for preterm labor? Preterm labor is more likely to occur in women who:  Have an infection while pregnant.  Have a cervix that is short.  Have gone into preterm labor before.  Have had surgery on their cervix.  Are younger than age 20.  Are older than age 33.  Are African American.  Are pregnant with two or more babies.  Take street drugs while pregnant.  Smoke while pregnant.  Do not gain enough weight while pregnant.  Got pregnant right after another pregnancy. What are the symptoms of preterm labor? Symptoms of preterm labor include:  Cramps. The cramps may feel like the cramps some women get during their period. The cramps may happen with watery poop (diarrhea).  Pain in the belly (abdomen).  Pain in the lower back.  Regular contractions or tightening. It may feel like your belly is getting tighter.  Pressure in the lower belly that seems to get stronger.  More fluid (discharge) leaking from the vagina. The fluid may be watery or bloody.  Water breaking. Why is it important to notice signs of preterm labor? Babies who are born early may not be fully developed. They have a higher chance for:  Long-term heart problems.  Long-term lung problems.  Trouble controlling body systems, like breathing.  Bleeding in the brain.  A condition called cerebral palsy.  Learning difficulties.  Death. These risks are highest for babies who are born before 69 weeks of pregnancy. How is preterm labor treated? Treatment depends on:  How long you were pregnant.  Your condition.  The health of your baby. Treatment may involve:  Having a stitch (suture) placed in your cervix. When you give birth, your cervix opens so the baby can come out. The stitch keeps the cervix  from opening too soon.  Staying at the hospital.  Taking or getting medicines, such as: ? Hormone medicines. ? Medicines to stop contractions. ? Medicines to help the babys lungs develop. ? Medicines to prevent your baby from having cerebral palsy. What should I do if I am in preterm labor? If you think you are going into labor too soon, call your doctor right away. How can I prevent preterm labor?  Do not use any tobacco products. ? Examples of these are cigarettes, chewing tobacco, and e-cigarettes. ? If you need help quitting, ask your doctor.  Do not use street drugs.  Do not use any medicines unless you ask your doctor if they are safe for you.  Talk with your doctor before taking any herbal supplements.  Make sure you gain enough weight.  Watch for infection. If you think you might have an infection, get it checked right away.  If you have gone into preterm labor before, tell your doctor. This information is not intended to replace advice given to you by your health care provider. Make sure you discuss any questions you have with your health care provider. Document Released: 01/29/2009 Document Revised: 02/24/2019 Document Reviewed: 03/25/2016 Elsevier Patient Education  2020 [employer].

## 2019-05-19 NOTE — MAU Provider Note (Signed)
History     CSN: [id number]  Arrival date and time: 05/19/19 1409   First Provider Initiated Contact with Patient 05/19/19 1452      Chief Complaint  Patient presents with  . Contractions  . Decreased Fetal Movement   Carolyn Vaughn is a 18 y.o. G2P0010 at [redacted]w[redacted]d who presents today with pressure and contractions. She denies any VB, but has had some mucous. She thought it was her mucous plug. She denies any loss of fluid. She denies any HA, visual disturbance or RUQ pain. She states that she has not been feeling fetal movement as much today. She has been feeling fetal movement since being. Next appt 05/24/2019. Patient has a BP cuff at home.   Pelvic Pain The patient's primary symptoms include pelvic pain. The patient's pertinent negatives include no vaginal discharge. This is a new problem. The current episode started yesterday. The problem occurs intermittently. The problem has been gradually worsening. The problem affects both sides. She is pregnant. Associated symptoms include back pain. Pertinent negatives include no chills, dysuria, fever, frequency, nausea or vomiting. The symptoms are aggravated by activity (certain positions). Treatments tried: resting, laying on side.  Sexual activity: patient has had intercourse in the last 24 hours.     OB History    Gravida  2   Para      Term      Preterm      AB  1   Living        SAB      TAB  1   Ectopic      Multiple      Live Births              Past Medical History:  Diagnosis Date  . Asthma     Past Surgical History:  Procedure Laterality Date  . NO PAST SURGERIES      Family History  Problem Relation Age of Onset  . Diabetes Maternal Grandmother   . Hyperlipidemia Maternal Grandmother   . Emphysema Mother   . Diabetes Brother   . Hyperlipidemia Maternal Aunt   . Diabetes Maternal Aunt   . Diabetes Paternal Grandmother     Social History   Tobacco Use  . Smoking status: Former  Smoker    Types: Cigars    Quit date: 12/05/2018    Years since quitting: 0.4  . Smokeless tobacco: Never Used  Substance Use Topics  . Alcohol use: No  . Drug use: No    Allergies: No Known Allergies  Medications Prior to Admission  Medication Sig Dispense Refill Last Dose  . Elastic Bandages & Supports (COMFORT FIT MATERNITY SUPP SM) MISC WEAR AS DIRECTED. (Patient not taking: Reported on 04/12/2019) 1 each 0   . Prenat w/o A-FeCbn-Meth-FA-DHA (PRENATE MINI) 29-0.6-0.4-350 MG CAPS Take 1 capsule by mouth daily before breakfast. 90 capsule 4   . Prenatal Vit-Fe Fumarate-FA (MULTIVITAMIN-PRENATAL) 27-0.8 MG TABS tablet Take 1 tablet by mouth daily at 12 noon.     . tinidazole (TINDAMAX) 500 MG tablet Take 2 tablets (1,000 mg total) by mouth daily with breakfast. (Patient not taking: Reported on 03/15/2019) 10 tablet 2     Review of Systems  Constitutional: Negative for chills and fever.  Gastrointestinal: Negative for nausea and vomiting.  Genitourinary: Positive for pelvic pain. Negative for dysuria, frequency, vaginal bleeding and vaginal discharge.  Musculoskeletal: Positive for back pain.   Physical Exam   Blood pressure 115/70, pulse (!) 102, temperature 98.9 F (  37.2 C), temperature source Oral, resp. rate 16, last menstrual period 10/09/2018, SpO2 99 %, unknown if currently breastfeeding.  Physical Exam  Nursing note and vitals reviewed. Constitutional: She is oriented to person, place, and time. She appears well-developed and well-nourished. No distress.  HENT:  Head: Normocephalic.  Cardiovascular: Normal rate.  Respiratory: Effort normal.  GI: Soft. There is no abdominal tenderness. There is no rebound.  Genitourinary:    Genitourinary Comments: Cervix: FT/50/-1/Vtx    Neurological: She is alert and oriented to person, place, and time.  Skin: Skin is warm and dry.  Psychiatric: She has a normal mood and affect.     NST:  Baseline: 145 Variability:  moderate Accels: 15x15 Decels: none Toco: frequent UI with occasional contraction    Results for orders placed or performed during the hospital encounter of 05/19/19 (from the past 24 hour(s))  Urinalysis, Routine w reflex microscopic     Status: None   Collection Time: 05/19/19  3:00 PM  Result Value Ref Range   Color, Urine YELLOW YELLOW   APPearance CLEAR CLEAR   Specific Gravity, Urine 1.010 1.005 - 1.030   pH 7.0 5.0 - 8.0   Glucose, UA NEGATIVE NEGATIVE mg/dL   Hgb urine dipstick NEGATIVE NEGATIVE   Bilirubin Urine NEGATIVE NEGATIVE   Ketones, ur NEGATIVE NEGATIVE mg/dL   Protein, ur NEGATIVE NEGATIVE mg/dL   Nitrite NEGATIVE NEGATIVE   Leukocytes,Ua NEGATIVE NEGATIVE  Protein / creatinine ratio, urine     Status: Abnormal   Collection Time: 05/19/19  3:00 PM  Result Value Ref Range   Creatinine, Urine 73.12 mg/dL   Total Protein, Urine 12 mg/dL   Protein Creatinine Ratio 0.16 (H) 0.00 - 0.15 mg/mg[Cre]  CBC with Differential/Platelet     Status: Abnormal   Collection Time: 05/19/19  3:03 PM  Result Value Ref Range   WBC 7.5 4.0 - 10.5 K/uL   RBC 3.69 (L) 3.87 - 5.11 MIL/uL   Hemoglobin 11.0 (L) 12.0 - 15.0 g/dL   HCT 32.5 (L) 36.0 - 46.0 %   MCV 88.1 80.0 - 100.0 fL   MCH 29.8 26.0 - 34.0 pg   MCHC 33.8 30.0 - 36.0 g/dL   RDW 12.4 11.5 - 15.5 %   Platelets 244 150 - 400 K/uL   nRBC 0.0 0.0 - 0.2 %   Neutrophils Relative % 65 %   Neutro Abs 4.9 1.7 - 7.7 K/uL   Lymphocytes Relative 24 %   Lymphs Abs 1.8 0.7 - 4.0 K/uL   Monocytes Relative 10 %   Monocytes Absolute 0.7 0.1 - 1.0 K/uL   Eosinophils Relative 0 %   Eosinophils Absolute 0.0 0.0 - 0.5 K/uL   Basophils Relative 0 %   Basophils Absolute 0.0 0.0 - 0.1 K/uL   Immature Granulocytes 1 %   Abs Immature Granulocytes 0.04 0.00 - 0.07 K/uL  Comprehensive metabolic panel     Status: Abnormal   Collection Time: 05/19/19  3:03 PM  Result Value Ref Range   Sodium 134 (L) 135 - 145 mmol/L   Potassium 3.7 3.5  - 5.1 mmol/L   Chloride 106 98 - 111 mmol/L   CO2 20 (L) 22 - 32 mmol/L   Glucose, Bld 79 70 - 99 mg/dL   BUN <5 (L) 6 - 20 mg/dL   Creatinine, Ser 0.58 0.44 - 1.00 mg/dL   Calcium 8.7 (L) 8.9 - 10.3 mg/dL   Total Protein 6.2 (L) 6.5 - 8.1 g/dL   Albumin 2.5 (  L) 3.5 - 5.0 g/dL   AST 21 15 - 41 U/L   ALT 16 0 - 44 U/L   Alkaline Phosphatase 62 38 - 126 U/L   Total Bilirubin 0.4 0.3 - 1.2 mg/dL   GFR calc non Af Amer >60 >60 mL/min   GFR calc Af Amer >60 >60 mL/min   Anion gap 8 5 - 15  Wet prep, genital     Status: Abnormal   Collection Time: 05/19/19  3:04 PM   Specimen: Cervix  Result Value Ref Range   Yeast Wet Prep HPF POC NONE SEEN NONE SEEN   Trich, Wet Prep NONE SEEN NONE SEEN   Clue Cells Wet Prep HPF POC NONE SEEN NONE SEEN   WBC, Wet Prep HPF POC MANY (A) NONE SEEN   Sperm NONE SEEN     MAU Course  Procedures  MDM 6:11 PM Patient has had 2L of LR and 2 doses of procardia. She reports that she is no longer feeling the pain/pressure that she was feeing when she got here. Cervix is unchanged.   BP is normal and was normal prior to getting procardia. She did have a couple diastolic pressures over 90 on arrival. No pre-eclampsia sx, labs normal. Has BP cuff at home and appt next week.   NST:  Baseline: 145 Variability: moderate Accels: 15x15 Decels: none Toco: no longer tracing any contractions or irritability   Assessment and Plan   1. Threatened premature labor in third trimester   2. Chlamydia infection during pregnancy   3. Supervision of other normal pregnancy, antepartum   4. [redacted] weeks gestation of pregnancy    DC home Comfort measures reviewed  3rd Trimester precautions  PTL precautions  Fetal kick counts RX: none  Return to MAU as needed FU with OB as planned  Follow-up Information    [facility] Follow up.   Contact information: [address] [place] [address] [place] DNP, CNM  05/19/19  6:11 PM

## 2019-05-19 NOTE — MAU Note (Signed)
Pressure and sharp pains in uterus started yesterday and a little today.  Only one baby movement today on way here and not much yesterday.  Mucus discharge like stringy yesterday and today mucus plug came out.  No bleeding.

## 2019-05-23 LAB — GC/CHLAMYDIA PROBE AMP (~~LOC~~) NOT AT ARMC
Chlamydia: POSITIVE — AB
Neisseria Gonorrhea: NEGATIVE

## 2019-05-24 ENCOUNTER — Encounter: Payer: Self-pay | Admitting: *Deleted

## 2019-05-24 ENCOUNTER — Telehealth: Payer: Medicaid Other | Admitting: Obstetrics

## 2019-05-24 ENCOUNTER — Telehealth (INDEPENDENT_AMBULATORY_CARE_PROVIDER_SITE_OTHER): Payer: Medicaid Other | Admitting: Obstetrics

## 2019-05-24 ENCOUNTER — Telehealth: Payer: Self-pay | Admitting: Medical

## 2019-05-24 ENCOUNTER — Encounter: Payer: Self-pay | Admitting: Obstetrics

## 2019-05-24 VITALS — BP 130/88 | HR 105

## 2019-05-24 DIAGNOSIS — O98813 Other maternal infectious and parasitic diseases complicating pregnancy, third trimester: Secondary | ICD-10-CM

## 2019-05-24 DIAGNOSIS — A749 Chlamydial infection, unspecified: Secondary | ICD-10-CM

## 2019-05-24 DIAGNOSIS — Z3480 Encounter for supervision of other normal pregnancy, unspecified trimester: Secondary | ICD-10-CM

## 2019-05-24 DIAGNOSIS — Z3A32 32 weeks gestation of pregnancy: Secondary | ICD-10-CM

## 2019-05-24 MED ORDER — CEFIXIME 400 MG PO CAPS: 400.0000 mg | ORAL_CAPSULE | Freq: Once | ORAL | 0 refills | Status: AC

## 2019-05-24 MED ORDER — AZITHROMYCIN 500 MG PO TABS: 1000.0000 mg | ORAL_TABLET | Freq: Once | ORAL | 0 refills | Status: AC

## 2019-05-24 MED ORDER — AZITHROMYCIN 250 MG PO TABS: 1000.0000 mg | ORAL_TABLET | Freq: Once | ORAL | 0 refills | Status: AC

## 2019-05-24 MED ORDER — PRENATE MINI 29-0.6-0.4-350 MG PO CAPS: 1.0000 | ORAL_CAPSULE | Freq: Every day | ORAL | 4 refills | Status: DC

## 2019-05-24 NOTE — Telephone Encounter (Addendum)
Carolyn Vaughn tested positive for  Chlamydia. Patient was called by RN and allergies and pharmacy confirmed. Rx sent to pharmacy of choice.   Luvenia Redden, PA-C 05/24/2019 11:15 AM      ----- Message from Bjorn Loser, RN sent at 05/24/2019 10:50 AM EDT ----- This patient tested positive for :  Chlamydia  She ::"has NKDA", I have informed the patient of her results and confirmed her pharmacy is correct in her chart. Please send Rx.   Thank you,   Bjorn Loser, RN   Results faxed to [facility].

## 2019-05-24 NOTE — Progress Notes (Signed)
Pt had recent visit to MAU and just got a call stating she is still +CH.  Pt states last tx given made her sick.   Advised pt to discuss tx to determine best medication option.

## 2019-05-24 NOTE — Progress Notes (Signed)
   TELEHEALTH OBSTETRICS PRENATAL VIRTUAL VIDEO VISIT ENCOUNTER NOTE  Provider location: [facility] [place]   I connected with Carolyn Vaughn on 05/24/19 at 11:00 AM EDT by WebEx OB MyChart Video Encounter at home and verified that I am speaking with the correct person using two identifiers.   I discussed the limitations, risks, security and privacy concerns of performing an evaluation and management service virtually and the availability of in person appointments. I also discussed with the patient that there may be a patient responsible charge related to this service. The patient expressed understanding and agreed to proceed. Subjective:  Carolyn Vaughn is a 18 y.o. G2P0010 at [redacted]w[redacted]d being seen today for ongoing prenatal care.  She is currently monitored for the following issues for this low-risk pregnancy and has Supervision of other normal pregnancy, antepartum and Chlamydia infection during pregnancy on their problem list.  Patient reports no complaints.  Contractions: Irritability. Vag. Bleeding: None.  Movement: Present. Denies any leaking of fluid.   The following portions of the patient's history were reviewed and updated as appropriate: allergies, current medications, past family history, past medical history, past social history, past surgical history and problem list.   Objective:   Vitals:   05/24/19 1110  BP: 130/88  Pulse: (!) 105    Fetal Status:     Movement: Present     General:  Alert, oriented and cooperative. Patient is in no acute distress.  Respiratory: Normal respiratory effort, no problems with respiration noted  Mental Status: Normal mood and affect. Normal behavior. Normal judgment and thought content.  Rest of physical exam deferred due to type of encounter  Imaging: No results found.  Assessment and Plan:  Pregnancy: G2P0010 at [redacted]w[redacted]d 1. Chlamydia infection during pregnancy Rx: - azithromycin (ZITHROMAX) 500  MG tablet; Take 2 tablets (1,000 mg total) by mouth once for 1 dose.  Dispense: 2 tablet; Refill: 0 - cefixime (SUPRAX) 400 MG CAPS capsule; Take 1 capsule (400 mg total) by mouth once for 1 dose.  Dispense: 1 capsule; Refill: 0  2. Supervision of other normal pregnancy, antepartum Rx: - Prenat w/o A-FeCbn-Meth-FA-DHA (PRENATE MINI) 29-0.6-0.4-350 MG CAPS; Take 1 capsule by mouth daily before breakfast.  Dispense: 90 capsule; Refill: 4  Preterm labor symptoms and general obstetric precautions including but not limited to vaginal bleeding, contractions, leaking of fluid and fetal movement were reviewed in detail with the patient. I discussed the assessment and treatment plan with the patient. The patient was provided an opportunity to ask questions and all were answered. The patient agreed with the plan and demonstrated an understanding of the instructions. The patient was advised to call back or seek an in-person office evaluation/go to MAU at [facility] for any urgent or concerning symptoms. Please refer to After Visit Summary for other counseling recommendations.   I provided 10 minutes of face-to-face time during this encounter.  Return in about 2 weeks (around 06/07/2019) for MyChart.    Baltazar Najjar, MD [facility] 05-24-2019

## 2019-06-07 ENCOUNTER — Telehealth: Payer: Medicaid Other | Admitting: Obstetrics and Gynecology

## 2019-06-14 ENCOUNTER — Telehealth: Payer: Self-pay | Admitting: Obstetrics and Gynecology

## 2019-06-15 ENCOUNTER — Telehealth: Payer: Medicaid Other | Admitting: Obstetrics

## 2019-06-23 ENCOUNTER — Telehealth: Payer: Self-pay | Admitting: Obstetrics

## 2019-06-27 ENCOUNTER — Encounter: Payer: Self-pay | Admitting: Obstetrics and Gynecology

## 2019-06-27 ENCOUNTER — Other Ambulatory Visit: Payer: Self-pay

## 2019-06-27 ENCOUNTER — Ambulatory Visit (INDEPENDENT_AMBULATORY_CARE_PROVIDER_SITE_OTHER): Payer: Medicaid Other | Admitting: Obstetrics and Gynecology

## 2019-06-27 ENCOUNTER — Other Ambulatory Visit (HOSPITAL_COMMUNITY)
Admission: RE | Admit: 2019-06-27 | Discharge: 2019-06-27 | Disposition: A | Discharge status: Home / Self Care | Payer: Medicaid Other | Source: Ambulatory Visit | Attending: Obstetrics and Gynecology | Admitting: Obstetrics and Gynecology

## 2019-06-27 VITALS — BP 122/76 | HR 92

## 2019-06-27 DIAGNOSIS — A749 Chlamydial infection, unspecified: Secondary | ICD-10-CM

## 2019-06-27 DIAGNOSIS — O98819 Other maternal infectious and parasitic diseases complicating pregnancy, unspecified trimester: Secondary | ICD-10-CM | POA: Insufficient documentation

## 2019-06-27 DIAGNOSIS — Z3480 Encounter for supervision of other normal pregnancy, unspecified trimester: Secondary | ICD-10-CM | POA: Diagnosis present

## 2019-06-27 DIAGNOSIS — O98813 Other maternal infectious and parasitic diseases complicating pregnancy, third trimester: Secondary | ICD-10-CM

## 2019-06-27 DIAGNOSIS — Z3A37 37 weeks gestation of pregnancy: Secondary | ICD-10-CM

## 2019-06-27 NOTE — Progress Notes (Signed)
   PRENATAL VISIT NOTE  Subjective:  Carolyn Vaughn is a 18 y.o. G2P0010 at [redacted]w[redacted]d being seen today for ongoing prenatal care.  She is currently monitored for the following issues for this low-risk pregnancy and has Supervision of other normal pregnancy, antepartum and Chlamydia infection during pregnancy on their problem list.  Patient reports no complaints.  Contractions: Irregular. Vag. Bleeding: None.  Movement: Present. Denies leaking of fluid.   The following portions of the patient's history were reviewed and updated as appropriate: allergies, current medications, past family history, past medical history, past social history, past surgical history and problem list.   Objective:   Vitals:   06/27/19 0844  BP: 122/76  Pulse: 92    Fetal Status: Fetal Heart Rate (bpm): 140 Fundal Height: 35 cm Movement: Present  Presentation: Vertex  General:  Alert, oriented and cooperative. Patient is in no acute distress.  Skin: Skin is warm and dry. No rash noted.   Cardiovascular: Normal heart rate noted  Respiratory: Normal respiratory effort, no problems with respiration noted  Abdomen: Soft, gravid, appropriate for gestational age.  Pain/Pressure: Present     Pelvic: Cervical exam performed Dilation: 1 Effacement (%): 50 Station: -3  Extremities: Normal range of motion.  Edema: None  Mental Status: Normal mood and affect. Normal behavior. Normal judgment and thought content.   Assessment and Plan:  Pregnancy: G2P0010 at [redacted]w[redacted]d 1. Supervision of other normal pregnancy, antepartum Patient is doing well Patient is still researching pediatricians  2. Chlamydia infection during pregnancy Cultures today  Term labor symptoms and general obstetric precautions including but not limited to vaginal bleeding, contractions, leaking of fluid and fetal movement were reviewed in detail with the patient. Please refer to After Visit Summary for other counseling recommendations.   Return  in about 1 week (around 07/04/2019) for [facility] [place].  No future appointments.  Mora Bellman, MD

## 2019-06-27 NOTE — Progress Notes (Signed)
Pt is here for ROB and TOC/ GBS. [redacted]w[redacted]d.

## 2019-06-28 LAB — CERVICOVAGINAL ANCILLARY ONLY
Chlamydia: POSITIVE — AB
Neisseria Gonorrhea: NEGATIVE

## 2019-06-28 MED ORDER — AZITHROMYCIN 500 MG PO TABS: 1000.0000 mg | ORAL_TABLET | Freq: Once | ORAL | 1 refills | Status: AC

## 2019-06-28 NOTE — Addendum Note (Signed)
Addended by: Mora Bellman on: 06/28/2019 01:27 PM   Modules accepted: Orders

## 2019-06-29 LAB — STREP GP B NAA: Strep Gp B NAA: NEGATIVE

## 2019-07-03 ENCOUNTER — Telehealth: Payer: Self-pay

## 2019-07-03 NOTE — Telephone Encounter (Signed)
Pt has not returned frequent calls regarding  +CT on 06/27/19 Pt sent message to [facility] today requesting to be induced . I replied to pt by letting her know we have tried to reach her regarding important results. Pt states she saw results and received our vm and started treatment medication  I wanted to confirm day pt started medication pt did not respond back.

## 2019-07-04 ENCOUNTER — Encounter: Payer: Self-pay | Admitting: Obstetrics

## 2019-07-04 ENCOUNTER — Telehealth (INDEPENDENT_AMBULATORY_CARE_PROVIDER_SITE_OTHER): Payer: Medicaid Other | Admitting: Obstetrics

## 2019-07-04 DIAGNOSIS — Z3A38 38 weeks gestation of pregnancy: Secondary | ICD-10-CM

## 2019-07-04 DIAGNOSIS — O98819 Other maternal infectious and parasitic diseases complicating pregnancy, unspecified trimester: Secondary | ICD-10-CM

## 2019-07-04 DIAGNOSIS — A749 Chlamydial infection, unspecified: Secondary | ICD-10-CM

## 2019-07-04 DIAGNOSIS — O98813 Other maternal infectious and parasitic diseases complicating pregnancy, third trimester: Secondary | ICD-10-CM

## 2019-07-04 DIAGNOSIS — Z3480 Encounter for supervision of other normal pregnancy, unspecified trimester: Secondary | ICD-10-CM

## 2019-07-04 NOTE — Progress Notes (Signed)
   TELEHEALTH OBSTETRICS PRENATAL VIRTUAL VIDEO VISIT ENCOUNTER NOTE  Provider location: [facility] [place]   I connected with Carolyn Vaughn on 07/04/19 at  2:30 PM EDT by WebEx OB MyChart Video Encounter at home and verified that I am speaking with the correct person using two identifiers.   I discussed the limitations, risks, security and privacy concerns of performing an evaluation and management service virtually and the availability of in person appointments. I also discussed with the patient that there may be a patient responsible charge related to this service. The patient expressed understanding and agreed to proceed. Subjective:  Carolyn Vaughn is a 18 y.o. G2P0010 at [redacted]w[redacted]d being seen today for ongoing prenatal care.  She is currently monitored for the following issues for this low-risk pregnancy and has Supervision of other normal pregnancy, antepartum and Chlamydia infection during pregnancy on their problem list.  Patient reports leaking of fluid after urinating.  Fluid is clear and without odor.  No leakage except after urination.  Contractions: Irregular.  .  Movement: Present.   The following portions of the patient's history were reviewed and updated as appropriate: allergies, current medications, past family history, past medical history, past social history, past surgical history and problem list.   Objective:  There were no vitals filed for this visit.  Fetal Status:     Movement: Present     General:  Alert, oriented and cooperative. Patient is in no acute distress.  Respiratory: Normal respiratory effort, no problems with respiration noted  Mental Status: Normal mood and affect. Normal behavior. Normal judgment and thought content.  Rest of physical exam deferred due to type of encounter  Imaging: No results found.  Assessment and Plan:  Pregnancy: G2P0010 at [redacted]w[redacted]d  1. Supervision of other normal pregnancy, antepartum   2. Chlamydia infection during pregnancy  3. Suspicious for PROM - patient instructed to go to [facility] for evaluation  Term labor symptoms and general obstetric precautions including but not limited to vaginal bleeding, contractions, leaking of fluid and fetal movement were reviewed in detail with the patient. I discussed the assessment and treatment plan with the patient. The patient was provided an opportunity to ask questions and all were answered. The patient agreed with the plan and demonstrated an understanding of the instructions. The patient was advised to call back or seek an in-person office evaluation/go to MAU at [facility] for any urgent or concerning symptoms. Please refer to After Visit Summary for other counseling recommendations.   I provided 10 minutes of face-to-face time during this encounter.  Return in about 1 week (around 07/11/2019) for ROB in person.  Future Appointments  Date Time Provider [place]  07/10/2019  1:00 PM Sloan Leiter, MD [place] None  07/17/2019 10:55 AM [place], Kathie Dike, CNM [facility] None    Baltazar Najjar, [place] for [facility] 07/04/2019

## 2019-07-04 NOTE — Progress Notes (Signed)
Pt states she is having increase in pelvic pain, hip pain and ctx. Pt cannot check BP for today's visit.    Pt states that she had a gush of fluid last night after she urinated and then again early this morning.  Pt states happens mostly when she is sleeping.   Pt states no odor to fluid.   Pt would like to discuss BC.

## 2019-07-05 ENCOUNTER — Inpatient Hospital Stay (HOSPITAL_COMMUNITY): Payer: Medicaid Other | Admitting: Anesthesiology

## 2019-07-05 ENCOUNTER — Other Ambulatory Visit: Payer: Self-pay

## 2019-07-05 ENCOUNTER — Encounter (HOSPITAL_COMMUNITY): Payer: Self-pay

## 2019-07-05 ENCOUNTER — Inpatient Hospital Stay (HOSPITAL_COMMUNITY)
Admission: AD | Admit: 2019-07-05 | Discharge: 2019-07-07 | DRG: 807 | Disposition: A | Discharge status: Home / Self Care | Payer: Medicaid Other | Attending: Obstetrics and Gynecology | Admitting: Obstetrics and Gynecology

## 2019-07-05 DIAGNOSIS — Z30017 Encounter for initial prescription of implantable subdermal contraceptive: Secondary | ICD-10-CM

## 2019-07-05 DIAGNOSIS — Z87891 Personal history of nicotine dependence: Secondary | ICD-10-CM

## 2019-07-05 DIAGNOSIS — Z20828 Contact with and (suspected) exposure to other viral communicable diseases: Secondary | ICD-10-CM | POA: Diagnosis present

## 2019-07-05 DIAGNOSIS — Z3A38 38 weeks gestation of pregnancy: Secondary | ICD-10-CM

## 2019-07-05 DIAGNOSIS — Z3480 Encounter for supervision of other normal pregnancy, unspecified trimester: Secondary | ICD-10-CM

## 2019-07-05 DIAGNOSIS — O133 Gestational [pregnancy-induced] hypertension without significant proteinuria, third trimester: Secondary | ICD-10-CM | POA: Diagnosis present

## 2019-07-05 DIAGNOSIS — O98819 Other maternal infectious and parasitic diseases complicating pregnancy, unspecified trimester: Secondary | ICD-10-CM | POA: Diagnosis present

## 2019-07-05 DIAGNOSIS — O134 Gestational [pregnancy-induced] hypertension without significant proteinuria, complicating childbirth: Principal | ICD-10-CM | POA: Diagnosis present

## 2019-07-05 DIAGNOSIS — A749 Chlamydial infection, unspecified: Secondary | ICD-10-CM

## 2019-07-05 LAB — CBC
HCT: 31.9 % — ABNORMAL LOW (ref 36.0–46.0)
HCT: 32.2 % — ABNORMAL LOW (ref 36.0–46.0)
Hemoglobin: 10.5 g/dL — ABNORMAL LOW (ref 12.0–15.0)
Hemoglobin: 10.6 g/dL — ABNORMAL LOW (ref 12.0–15.0)
MCH: 27.6 pg (ref 26.0–34.0)
MCH: 27.7 pg (ref 26.0–34.0)
MCHC: 32.9 g/dL (ref 30.0–36.0)
MCHC: 32.9 g/dL (ref 30.0–36.0)
MCV: 83.7 fL (ref 80.0–100.0)
MCV: 84.1 fL (ref 80.0–100.0)
Platelets: 293 K/uL (ref 150–400)
Platelets: 280 10*3/uL (ref 150–400)
RBC: 3.81 MIL/uL — ABNORMAL LOW (ref 3.87–5.11)
RBC: 3.83 MIL/uL — ABNORMAL LOW (ref 3.87–5.11)
RDW: 12.8 % (ref 11.5–15.5)
RDW: 12.8 % (ref 11.5–15.5)
WBC: 8 K/uL (ref 4.0–10.5)
WBC: 9.7 10*3/uL (ref 4.0–10.5)
nRBC: 0 % (ref 0.0–0.2)
nRBC: 0 % (ref 0.0–0.2)

## 2019-07-05 LAB — COMPREHENSIVE METABOLIC PANEL
ALT: 11 U/L (ref 0–44)
AST: 15 U/L (ref 15–41)
Albumin: 2.5 g/dL — ABNORMAL LOW (ref 3.5–5.0)
Alkaline Phosphatase: 130 U/L — ABNORMAL HIGH (ref 38–126)
Anion gap: 9 (ref 5–15)
BUN: 8 mg/dL (ref 6–20)
CO2: 20 mmol/L — ABNORMAL LOW (ref 22–32)
Calcium: 9 mg/dL (ref 8.9–10.3)
Chloride: 105 mmol/L (ref 98–111)
Creatinine, Ser: 0.67 mg/dL (ref 0.44–1.00)
GFR calc Af Amer: >60 mL/min (ref >60)
GFR calc non Af Amer: >60 mL/min (ref >60)
Glucose, Bld: 97 mg/dL (ref 70–99)
Potassium: 3.6 mmol/L (ref 3.5–5.1)
Sodium: 134 mmol/L — ABNORMAL LOW (ref 135–145)
Total Bilirubin: 0.6 mg/dL (ref 0.3–1.2)
Total Protein: 6.3 g/dL — ABNORMAL LOW (ref 6.5–8.1)

## 2019-07-05 LAB — TYPE AND SCREEN
ABO/RH(D): A POS
Antibody Screen: NEGATIVE

## 2019-07-05 LAB — ABO/RH: ABO/RH(D): A POS

## 2019-07-05 LAB — AMNISURE RUPTURE OF MEMBRANE (ROM) NOT AT ARMC: Amnisure ROM: NEGATIVE

## 2019-07-05 LAB — PROTEIN / CREATININE RATIO, URINE
Creatinine, Urine: 48.37 mg/dL
Protein Creatinine Ratio: 0.25 mg/mg{Cre} — ABNORMAL HIGH (ref 0.00–0.15)
Total Protein, Urine: 12 mg/dL

## 2019-07-05 LAB — RPR: RPR Ser Ql: NONREACTIVE

## 2019-07-05 LAB — SARS CORONAVIRUS 2 BY RT PCR (HOSPITAL ORDER, PERFORMED IN ~~LOC~~ HOSPITAL LAB): SARS Coronavirus 2: NEGATIVE

## 2019-07-05 MED ORDER — ONDANSETRON HCL 4 MG/2ML IJ SOLN: 4.0000 mg | INTRAMUSCULAR | Status: DC | PRN

## 2019-07-05 MED ORDER — PHENYLEPHRINE 40 MCG/ML (10ML) SYRINGE FOR IV PUSH (FOR BLOOD PRESSURE SUPPORT): 80.0000 ug | PREFILLED_SYRINGE | INTRAVENOUS | Status: DC | PRN

## 2019-07-05 MED ORDER — WITCH HAZEL-GLYCERIN EX PADS: 1.0000 "application " | MEDICATED_PAD | CUTANEOUS | Status: DC | PRN

## 2019-07-05 MED ORDER — ACETAMINOPHEN 325 MG PO TABS
650.0000 mg | ORAL_TABLET | ORAL | Status: DC | PRN
  Administered 2019-07-05: 650 mg via ORAL
  Filled 2019-07-05: qty 2

## 2019-07-05 MED ORDER — ACETAMINOPHEN 325 MG PO TABS: 650.0000 mg | ORAL_TABLET | ORAL | Status: DC | PRN

## 2019-07-05 MED ORDER — MEASLES, MUMPS & RUBELLA VAC IJ SOLR: 0.5000 mL | Freq: Once | INTRAMUSCULAR | Status: DC

## 2019-07-05 MED ORDER — SODIUM CHLORIDE (PF) 0.9 % IJ SOLN
INTRAMUSCULAR | Status: DC | PRN
  Administered 2019-07-05: 12 mL/h via EPIDURAL

## 2019-07-05 MED ORDER — SIMETHICONE 80 MG PO CHEW
80.0000 mg | CHEWABLE_TABLET | ORAL | Status: DC | PRN
  Administered 2019-07-06: 20:00:00 80 mg via ORAL
  Filled 2019-07-05: qty 1

## 2019-07-05 MED ORDER — OXYCODONE-ACETAMINOPHEN 5-325 MG PO TABS: 1.0000 | ORAL_TABLET | ORAL | Status: DC | PRN

## 2019-07-05 MED ORDER — ONDANSETRON HCL 4 MG PO TABS: 4.0000 mg | ORAL_TABLET | ORAL | Status: DC | PRN

## 2019-07-05 MED ORDER — LIDOCAINE HCL (PF) 1 % IJ SOLN: 30.0000 mL | INTRAMUSCULAR | Status: DC | PRN

## 2019-07-05 MED ORDER — LACTATED RINGERS IV SOLN: 500.0000 mL | INTRAVENOUS | Status: DC | PRN

## 2019-07-05 MED ORDER — OXYCODONE-ACETAMINOPHEN 5-325 MG PO TABS: 2.0000 | ORAL_TABLET | ORAL | Status: DC | PRN

## 2019-07-05 MED ORDER — BENZOCAINE-MENTHOL 20-0.5 % EX AERO: 1.0000 "application " | INHALATION_SPRAY | CUTANEOUS | Status: DC | PRN

## 2019-07-05 MED ORDER — LACTATED RINGERS IV SOLN
500.0000 mL | Freq: Once | INTRAVENOUS | Status: AC
  Administered 2019-07-05: 16:00:00 500 mL via INTRAVENOUS

## 2019-07-05 MED ORDER — COCONUT OIL OIL: 1.0000 "application " | TOPICAL_OIL | Status: DC | PRN

## 2019-07-05 MED ORDER — LACTATED RINGERS IV SOLN
INTRAVENOUS | Status: DC
  Administered 2019-07-05 (×3): via INTRAVENOUS

## 2019-07-05 MED ORDER — SENNOSIDES-DOCUSATE SODIUM 8.6-50 MG PO TABS
2.0000 | ORAL_TABLET | ORAL | Status: DC
  Administered 2019-07-06: 2 via ORAL
  Filled 2019-07-05 (×2): qty 2

## 2019-07-05 MED ORDER — FENTANYL-BUPIVACAINE-NACL 0.5-0.125-0.9 MG/250ML-% EP SOLN
12.0000 mL/h | EPIDURAL | Status: DC | PRN
  Filled 2019-07-05: qty 250

## 2019-07-05 MED ORDER — EPHEDRINE 5 MG/ML INJ: 10.0000 mg | INTRAVENOUS | Status: DC | PRN

## 2019-07-05 MED ORDER — TETANUS-DIPHTH-ACELL PERTUSSIS 5-2.5-18.5 LF-MCG/0.5 IM SUSP: 0.5000 mL | Freq: Once | INTRAMUSCULAR | Status: DC

## 2019-07-05 MED ORDER — OXYTOCIN BOLUS FROM INFUSION
500.0000 mL | Freq: Once | INTRAVENOUS | Status: AC
  Administered 2019-07-05: 500 mL via INTRAVENOUS

## 2019-07-05 MED ORDER — OXYTOCIN 40 UNITS IN NORMAL SALINE INFUSION - SIMPLE MED
2.5000 [IU]/h | INTRAVENOUS | Status: DC
  Filled 2019-07-05: qty 1000

## 2019-07-05 MED ORDER — DIPHENHYDRAMINE HCL 25 MG PO CAPS: 25.0000 mg | ORAL_CAPSULE | Freq: Four times a day (QID) | ORAL | Status: DC | PRN

## 2019-07-05 MED ORDER — IBUPROFEN 600 MG PO TABS
600.0000 mg | ORAL_TABLET | Freq: Four times a day (QID) | ORAL | Status: DC
  Administered 2019-07-06 – 2019-07-07 (×6): 600 mg via ORAL
  Filled 2019-07-05 (×7): qty 1

## 2019-07-05 MED ORDER — DIBUCAINE (PERIANAL) 1 % EX OINT: 1.0000 "application " | TOPICAL_OINTMENT | CUTANEOUS | Status: DC | PRN

## 2019-07-05 MED ORDER — OXYTOCIN 40 UNITS IN NORMAL SALINE INFUSION - SIMPLE MED
1.0000 m[IU]/min | INTRAVENOUS | Status: DC
  Administered 2019-07-05: 04:00:00 2 m[IU]/min via INTRAVENOUS

## 2019-07-05 MED ORDER — SOD CITRATE-CITRIC ACID 500-334 MG/5ML PO SOLN
30.0000 mL | ORAL | Status: DC | PRN
  Administered 2019-07-05: 30 mL via ORAL
  Filled 2019-07-05: qty 30

## 2019-07-05 MED ORDER — LIDOCAINE HCL (PF) 1 % IJ SOLN
INTRAMUSCULAR | Status: DC | PRN
  Administered 2019-07-05 (×2): 5 mL via EPIDURAL

## 2019-07-05 MED ORDER — TERBUTALINE SULFATE 1 MG/ML IJ SOLN: 0.2500 mg | Freq: Once | INTRAMUSCULAR | Status: DC | PRN

## 2019-07-05 MED ORDER — PRENATAL MULTIVITAMIN CH
1.0000 | ORAL_TABLET | Freq: Every day | ORAL | Status: DC
  Administered 2019-07-06 – 2019-07-07 (×2): 1 via ORAL
  Filled 2019-07-05 (×2): qty 1

## 2019-07-05 MED ORDER — FENTANYL-BUPIVACAINE-NACL 0.5-0.125-0.9 MG/250ML-% EP SOLN: 12.0000 mL/h | EPIDURAL | Status: DC | PRN

## 2019-07-05 MED ORDER — FLEET ENEMA 7-19 GM/118ML RE ENEM: 1.0000 | ENEMA | RECTAL | Status: DC | PRN

## 2019-07-05 MED ORDER — ONDANSETRON HCL 4 MG/2ML IJ SOLN: 4.0000 mg | Freq: Four times a day (QID) | INTRAMUSCULAR | Status: DC | PRN

## 2019-07-05 MED ORDER — DIPHENHYDRAMINE HCL 50 MG/ML IJ SOLN: 12.5000 mg | INTRAMUSCULAR | Status: DC | PRN

## 2019-07-05 NOTE — Progress Notes (Signed)
Carolyn Vaughn is a 18 y.o. G2P0010 at [redacted]w[redacted]d by LMP admitted for induction of labor due to gestational Hypertension.  Subjective: Doing well. Has been having trouble sleeping overnight. Was able to get some sleep at 5am, just woken up now. Has not felt strong contractions yet. Feels them very mild.   Objective: BP 132/82   Pulse 92   Temp 97.9 F (36.6 C) (Oral)   Resp 18   Ht 5\' 1"  (1.549 m)   Wt 56.3 kg   LMP 10/09/2018   SpO2 100%   BMI 23.45 kg/m  No intake/output data recorded. No intake/output data recorded.  FHT:  FHR: 135 bpm, variability: moderate,  accelerations:  Present,  decelerations:  Absent UC:   regular, every 2-4 minutes SVE:   Dilation: 3 Effacement (%): 80 Station: 0 Exam by:: Carolyn Vaughn RNC  Labs: Lab Results  Component Value Date   WBC 8.0 07/05/2019   HGB 10.5 (L) 07/05/2019   HCT 31.9 (L) 07/05/2019   MCV 83.7 07/05/2019   PLT 293 07/05/2019    Assessment / Plan: Induction of labor due to gestational hypertension,  progressing well on pitocin  Labor: Progressing on Pitocin, will continue to increase then AROM Preeclampsia:  no signs or symptoms of toxicity Fetal Wellbeing:  Category I Pain Control:  Epidural is considering epidural I/D:  HISTORY OF CHLAMYDIA, recently treated. 8/13 Anticipated MOD:  NSVD  Carolyn Vaughn 07/05/2019, 10:30 AM

## 2019-07-05 NOTE — Anesthesia Procedure Notes (Signed)
Epidural Patient location during procedure: OB  Staffing Anesthesiologist: Montez Hageman, MD Performed: anesthesiologist   Preanesthetic Checklist Completed: patient identified, site marked, surgical consent, pre-op evaluation, timeout performed, IV checked, risks and benefits discussed and monitors and equipment checked  Epidural Patient position: sitting Prep: DuraPrep Patient monitoring: heart rate, continuous pulse ox and blood pressure Approach: right paramedian Location: L3-L4 Injection technique: LOR saline  Needle:  Needle type: Tuohy  Needle gauge: 17 G Needle length: 9 cm and 9 Needle insertion depth: 5 cm Catheter type: closed end flexible Catheter size: 20 Guage Catheter at skin depth: 9 cm Test dose: negative  Assessment Events: blood not aspirated, injection not painful, no injection resistance, negative IV test and no paresthesia  Additional Notes Patient identified. Risks/Benefits/Options discussed with patient including but not limited to bleeding, infection, nerve damage, paralysis, failed block, incomplete pain control, headache, blood pressure changes, nausea, vomiting, reactions to medication both or allergic, itching and postpartum back pain. Confirmed with bedside nurse the patient's most recent platelet count. Confirmed with patient that they are not currently taking any anticoagulation, have any bleeding history or any family history of bleeding disorders. Patient expressed understanding and wished to proceed. All questions were answered. Sterile technique was used throughout the entire procedure. Please see nursing notes for vital signs. Test dose was given through epidural needle and negative prior to continuing to dose epidural or start infusion. Warning signs of high block given to the patient including shortness of breath, tingling/numbness in hands, complete motor block, or any concerning symptoms with instructions to call for help. Patient was given  instructions on fall risk and not to get out of bed. All questions and concerns addressed with instructions to call with any issues.

## 2019-07-05 NOTE — Anesthesia Preprocedure Evaluation (Signed)
Anesthesia Evaluation  Patient identified by MRN, date of birth, ID band Patient awake    Reviewed: Allergy & Precautions, H&P , NPO status , Patient's Chart, lab work & pertinent test results  History of Anesthesia Complications Negative for: history of anesthetic complications  Airway Mallampati: II  TM Distance: >3 FB Neck ROM: full    Dental no notable dental hx. (+) Teeth Intact   Pulmonary asthma , former smoker,    Pulmonary exam normal breath sounds clear to auscultation       Cardiovascular hypertension (PIH), Normal cardiovascular exam Rhythm:regular Rate:Normal     Neuro/Psych negative neurological ROS  negative psych ROS   GI/Hepatic negative GI ROS, Neg liver ROS,   Endo/Other  negative endocrine ROS  Renal/GU negative Renal ROS  negative genitourinary   Musculoskeletal   Abdominal   Peds  Hematology negative hematology ROS (+)   Anesthesia Other Findings   Reproductive/Obstetrics (+) Pregnancy                             Anesthesia Physical Anesthesia Plan  ASA: II  Anesthesia Plan: Epidural   Post-op Pain Management:    Induction:   PONV Risk Score and Plan:   Airway Management Planned:   Additional Equipment:   Intra-op Plan:   Post-operative Plan:   Informed Consent: I have reviewed the patients History and Physical, chart, labs and discussed the procedure including the risks, benefits and alternatives for the proposed anesthesia with the patient or authorized representative who has indicated his/her understanding and acceptance.       Plan Discussed with:   Anesthesia Plan Comments:         Anesthesia Quick Evaluation

## 2019-07-05 NOTE — Progress Notes (Signed)
Carolyn Vaughn is a 18 y.o. G2P0010 at [redacted]w[redacted]d by LMP admitted for gestational HTN.  Subjective: Having more regular and painful contraction now with Pitocin. Anaesthesia has seen her and is waiting for CBC before proceeding with epidural.  Objective: BP 116/71   Pulse 66   Temp 97.9 F (36.6 C) (Oral)   Resp 16   Ht 5\' 1"  (1.549 m)   Wt 56.3 kg   LMP 10/09/2018   SpO2 100%   BMI 23.45 kg/m  No intake/output data recorded. No intake/output data recorded.  FHT:  FHR: 145 bpm, variability: moderate,  accelerations:  Present,  decelerations:  Absent UC:   regular, every 2-3 minutes SVE:   Dilation: 3 Effacement (%): 80 Station: 0 Exam by:: Wess Botts RNC  Labs: Lab Results  Component Value Date   WBC 9.7 07/05/2019   HGB 10.6 (L) 07/05/2019   HCT 32.2 (L) 07/05/2019   MCV 84.1 07/05/2019   PLT 280 07/05/2019    Assessment / Plan: Induction of labor due to gestational hypertension,  progressing well on pitocin  Labor: Progressing normally Preeclampsia:  no signs or symptoms of toxicity BP129/82 Fetal Wellbeing:  Category I Pain Control:  Labor support without medications Wants epidural. Anaesthesia has seen pt. I/D:  n/a Anticipated MOD:  NSVD  Asier Desroches 07/05/2019, 2:09 PM

## 2019-07-05 NOTE — MAU Note (Addendum)
Pt. Reported a small trickle of water on 07/04/19 at 0230 and had a video visit where she was advised to come to MAU. PT states she fell asleep and did not come.  At 2336 on 07/04/19 she felt another gush of water and felt last FM at 2345 but none since. Pt. Does not report any CTX

## 2019-07-05 NOTE — H&P (Signed)
Obstetric History and Physical  Carolyn Vaughn is a 18 y.o. G2P0010 with IUP at [redacted]w[redacted]d presenting for gestational hypertension. Pt presented to MAU for evaluation of LOF; amnisure & fern were negative. While in MAU had consistently elevated BPs (139-154/89-106). No history of hypertension. Denies headache, visual disturbance, or epigastric pain. PEC labs negative.    Prenatal Course Source of Care: [facility]  with onset of care at 17 weeks Pregnancy complications or risks: Patient Active Problem List   Diagnosis Date Noted  . Chlamydia infection during pregnancy 04/26/2019  . Supervision of other normal pregnancy, antepartum 02/08/2019   She plans to breastfeed She desires depo or nexplanon for postpartum contraception.   Prenatal labs and studies: ABO, Rh: A/Positive/-- (03/25 1539) Antibody: Negative (03/25 1539) Rubella: 4.76 (03/25 1539) RPR: Non Reactive (06/10 0847)  HBsAg: Negative (03/25 1539)  HIV: Non Reactive (06/10 0847)  WUJ:WJXBJYNW (08/11 0910) 1 hr gtt: 72 Genetic screening normal Anatomy US normal  Prenatal Transfer Tool  Maternal Diabetes: No Genetic Screening: Normal Maternal Ultrasounds/Referrals: Normal Fetal Ultrasounds or other Referrals:  None Maternal Substance Abuse:  No Significant Maternal Medications:  None Significant Maternal Lab Results: Group B Strep negative  Past Medical History:  Diagnosis Date  . Asthma     Past Surgical History:  Procedure Laterality Date  . NO PAST SURGERIES      OB History  Gravida Para Term Preterm AB Living  2       1    SAB TAB Ectopic Multiple Live Births    1          # Outcome Date GA Lbr Len/2nd Weight Sex Delivery Anes PTL Lv  2 Current           1 TAB             Social History   Socioeconomic History  . Marital status: Single    Spouse name: Not on file  . Number of children: Not on file  . Years of education: Not on file  . Highest education level: Not on file   Occupational History  . Not on file  Social Needs  . Financial resource strain: Not on file  . Food insecurity    Worry: Not on file    Inability: Not on file  . Transportation needs    Medical: Not on file    Non-medical: Not on file  Tobacco Use  . Smoking status: Former Smoker    Types: Cigars    Quit date: 12/05/2018    Years since quitting: 0.5  . Smokeless tobacco: Never Used  Substance and Sexual Activity  . Alcohol use: No  . Drug use: No  . Sexual activity: Not Currently    Birth control/protection: None  Lifestyle  . Physical activity    Days per week: Not on file    Minutes per session: Not on file  . Stress: Not on file  Relationships  . Social Herbalist on phone: Not on file    Gets together: Not on file    Attends religious service: Not on file    Active member of club or organization: Not on file    Attends meetings of clubs or organizations: Not on file    Relationship status: Not on file  Other Topics Concern  . Not on file  Social History Narrative  . Not on file    Family History  Problem Relation Age of Onset  . Diabetes Maternal  Grandmother   . Hyperlipidemia Maternal Grandmother   . Emphysema Mother   . Diabetes Brother   . Hyperlipidemia Maternal Aunt   . Diabetes Maternal Aunt   . Diabetes Paternal Grandmother     Medications Prior to Admission  Medication Sig Dispense Refill Last Dose  . Elastic Bandages & Supports (COMFORT FIT MATERNITY SUPP SM) MISC WEAR AS DIRECTED. 1 each 0 Past Month at Unknown time  . Prenat w/o A-FeCbn-Meth-FA-DHA (PRENATE MINI) 29-0.6-0.4-350 MG CAPS Take 1 capsule by mouth daily before breakfast. 90 capsule 4 07/05/2019 at Unknown time  . Prenatal Vit-Fe Fumarate-FA (MULTIVITAMIN-PRENATAL) 27-0.8 MG TABS tablet Take 1 tablet by mouth daily at 12 noon.     . tinidazole (TINDAMAX) 500 MG tablet Take 2 tablets (1,000 mg total) by mouth daily with breakfast. (Patient not taking: Reported on 03/15/2019) 10  tablet 2     No Known Allergies  Review of Systems: Negative except for what is mentioned in HPI.  Physical Exam: BP (!) 146/96   Pulse 80   Temp 98.2 F (36.8 C) (Oral)   Resp 19   Wt 56.3 kg   LMP 10/09/2018   SpO2 100%   BMI 23.47 kg/m  CONSTITUTIONAL: Well-developed, well-nourished female in no acute distress.  HENT:  Normocephalic, atraumatic, External right and left ear normal. Oropharynx is clear and moist EYES: Conjunctivae and EOM are normal. Pupils are equal, round, and reactive to light. No scleral icterus.  NECK: Normal range of motion, supple, no masses SKIN: Skin is warm and dry. No rash noted. Not diaphoretic. No erythema. No pallor. NEUROLOGIC: Alert and oriented to person, place, and time. Normal reflexes, muscle tone coordination. No cranial nerve deficit noted. PSYCHIATRIC: Normal mood and affect. Normal behavior. Normal judgment and thought content. CARDIOVASCULAR: Normal heart rate noted, regular rhythm RESPIRATORY: Effort and breath sounds normal, no problems with respiration noted ABDOMEN: Soft, nontender, nondistended, gravid. MUSCULOSKELETAL: Normal range of motion. No edema and no tenderness. 2+ distal pulses.  Cervical Exam:  deferred   Presentation: cephalic by BSUS FHT:  NST:  Baseline: 140 bpm, Variability: Good {> 6 bpm), Accelerations: Reactive and Decelerations: Absent   Pertinent Labs/Studies:   Results for orders placed or performed during the hospital encounter of 07/05/19 (from the past 24 hour(s))  CBC     Status: Abnormal   Collection Time: 07/05/19  1:10 AM  Result Value Ref Range   WBC 8.0 4.0 - 10.5 K/uL   RBC 3.81 (L) 3.87 - 5.11 MIL/uL   Hemoglobin 10.5 (L) 12.0 - 15.0 g/dL   HCT 31.9 (L) 36.0 - 46.0 %   MCV 83.7 80.0 - 100.0 fL   MCH 27.6 26.0 - 34.0 pg   MCHC 32.9 30.0 - 36.0 g/dL   RDW 12.8 11.5 - 15.5 %   Platelets 293 150 - 400 K/uL   nRBC 0.0 0.0 - 0.2 %  Comprehensive metabolic panel     Status: Abnormal   Collection  Time: 07/05/19  1:10 AM  Result Value Ref Range   Sodium 134 (L) 135 - 145 mmol/L   Potassium 3.6 3.5 - 5.1 mmol/L   Chloride 105 98 - 111 mmol/L   CO2 20 (L) 22 - 32 mmol/L   Glucose, Bld 97 70 - 99 mg/dL   BUN 8 6 - 20 mg/dL   Creatinine, Ser 0.67 0.44 - 1.00 mg/dL   Calcium 9.0 8.9 - 10.3 mg/dL   Total Protein 6.3 (L) 6.5 - 8.1 g/dL   Albumin  2.5 (L) 3.5 - 5.0 g/dL   AST 15 15 - 41 U/L   ALT 11 0 - 44 U/L   Alkaline Phosphatase 130 (H) 38 - 126 U/L   Total Bilirubin 0.6 0.3 - 1.2 mg/dL   GFR calc non Af Amer >60 >60 mL/min   GFR calc Af Amer >60 >60 mL/min   Anion gap 9 5 - 15  Amnisure rupture of membrane (rom)not at [facility]     Status: None   Collection Time: 07/05/19  1:20 AM  Result Value Ref Range   Amnisure ROM NEGATIVE   Protein / creatinine ratio, urine     Status: Abnormal   Collection Time: 07/05/19  1:32 AM  Result Value Ref Range   Creatinine, Urine 48.37 mg/dL   Total Protein, Urine 12 mg/dL   Protein Creatinine Ratio 0.25 (H) 0.00 - 0.15 mg/mg[Cre]    Assessment : JAZMYN OFFNER is a 18 y.o. G2P0010 at [redacted]w[redacted]d being admitted for induction of labor due to gestational hypertension.  Plan: Labor: Induction as ordered as per protocol.  Monitor BPs Analgesia as needed. FWB: Reassuring fetal heart tracing.   GBS negative Delivery plan: Hopeful for vaginal delivery   Jorje Guild, NP

## 2019-07-05 NOTE — Discharge Summary (Addendum)
Postpartum Discharge Summary     Patient Name: Carolyn Vaughn DOB: 2001-08-25 MRN: [id number]  Date of admission: 07/05/2019 Delivering Provider: Chauncey Mann   Date of discharge: 07/07/2019  Admitting diagnosis: 38 WKS, LEAKING FLUIDS Intrauterine pregnancy: [redacted]w[redacted]d     Secondary diagnosis:  Active Problems:   Chlamydia infection during pregnancy   Gestational hypertension, third trimester  Additional problems: None     Discharge diagnosis: Term Pregnancy Delivered and Gestational Hypertension                                                                                                Post partum procedures:Nexplanon placement   Augmentation: Pitocin  Complications: None  Hospital course:  Induction of Labor With Vaginal Delivery   18 y.o. yo G2P0010 at [redacted]w[redacted]d was admitted to the hospital 07/05/2019 for induction of labor.  Indication for induction: Gestational hypertension.  Patient had an uncomplicated labor course as follows. Patient presented to MAU for evaluation of LOF; amnisure & fern were negative. While in MAU had consistently elevated BPs (139-154/89-106). No history of hypertension. Denies headache, visual disturbance, or epigastric pain. PEC labs negative. Initial SVE: 1/80%/0. Patient was started on Pitocin, received epidural and had SROM.  Membrane Rupture Time/Date: 4:20 PM ,07/05/2019   Intrapartum Procedures: Episiotomy: None [1]                                         Lacerations:  None [1]  Patient had delivery of a Viable infant.  Information for the patient's newborn:  [place], [place] Girl [place] [[id number]]  Delivery Method: Vaginal, Spontaneous(Filed from Delivery Summary)    07/05/2019  Details of delivery can be found in separate delivery note.  Patient had a routine postpartum course including Nexplanon placement prior to d/c (see procedure note). Her BPs remained normotensive to slightly borderline PP but she did not require  antihypertensives. Patient is discharged home 07/07/19.  Magnesium Sulfate recieved: No BMZ received: No  Physical exam  Vitals:   07/06/19 0500 07/06/19 1514 07/06/19 2155 07/07/19 0525  BP: 124/84 (!) 129/91 132/89 126/86  Pulse: 86 77 73 78  Resp: 18 17 16 18   Temp: 98.1 F (36.7 C) 98.3 F (36.8 C) 97.6 F (36.4 C) 98 F (36.7 C)  TempSrc:  Oral Oral Oral  SpO2: 100%   100%  Weight:      Height:       General: alert and cooperative Lochia: appropriate Uterine Fundus: firm Incision: N/A DVT Evaluation: No evidence of DVT seen on physical exam. Labs: Lab Results  Component Value Date   WBC 9.7 07/05/2019   HGB 10.6 (L) 07/05/2019   HCT 32.2 (L) 07/05/2019   MCV 84.1 07/05/2019   PLT 280 07/05/2019   CMP Latest Ref Rng & Units 07/05/2019  Glucose 70 - 99 mg/dL 97  BUN 6 - 20 mg/dL 8  Creatinine 0.44 - 1.00 mg/dL 0.67  Sodium 135 - 145 mmol/L 134(L)  Potassium 3.5 - 5.1 mmol/L 3.6  Chloride 98 - 111 mmol/L 105  CO2 22 - 32 mmol/L 20(L)  Calcium 8.9 - 10.3 mg/dL 9.0  Total Protein 6.5 - 8.1 g/dL 6.3(L)  Total Bilirubin 0.3 - 1.2 mg/dL 0.6  Alkaline Phos 38 - 126 U/L 130(H)  AST 15 - 41 U/L 15  ALT 0 - 44 U/L 11    Discharge instruction: per After Visit Summary and "Baby and Me Booklet".  After visit meds:  Allergies as of 07/07/2019   No Known Allergies     Medication List    STOP taking these medications   Comfort Fit Maternity Supp Sm Misc   multivitamin-prenatal 27-0.8 MG Tabs tablet   tinidazole 500 MG tablet Commonly known as: Tindamax     TAKE these medications   ibuprofen 600 MG tablet Commonly known as: ADVIL Take 1 tablet (600 mg total) by mouth every 6 (six) hours as needed.   Prenate Mini 29-0.6-0.4-350 MG Caps Take 1 capsule by mouth daily before breakfast.       Diet: routine diet  Activity: Advance as tolerated. Pelvic rest for 6 weeks.   Outpatient follow up:1wk for BP check; 4wks for PP visit Follow up Appt: Future  Appointments  Date Time Provider [place]  07/13/2019 10:30 AM [place] None  08/03/2019  1:00 PM Lajean Manes, CNM [facility] None   Follow up Visit:   Please schedule this patient for Postpartum visit in: 1 week for BP check and 4 weeks for PP visit with the following provider: Any provider Low risk pregnancy complicated by: gHTN Delivery mode:  SVD Anticipated Birth Control:  Nexplanon- placed prior to d/c PP Procedures needed: BP check  Schedule Integrated BH visit: no    Newborn Data: Live born female  Birth Weight: 2740gm (6lb 0.7oz) APGAR: 9, 9  Newborn Delivery   Birth date/time: 07/05/2019 17:09:00 Delivery type: Vaginal, Spontaneous      Baby Feeding: Bottle Disposition:home with mother   07/07/2019 Myrtis Ser, CNM

## 2019-07-05 NOTE — Progress Notes (Signed)
Patient ID: Carolyn Vaughn, female   DOB: 17-Oct-2001, 18 y.o.   MRN: [id number] Doing well Denies headache or visual changes Denies RUQ pain  No edema  FHR reactive with UCs every 4-5 min  Dilation: 1 Effacement (%): 80 Station: 0 Presentation: Vertex Exam by:: Carmelia Roller, CNM  Will start Pitocin due to Bishop score of 8 Reviewed analgesia options

## 2019-07-06 NOTE — Lactation Note (Signed)
This note was copied from a baby's chart. Lactation Consultation Note  Patient Name: Carolyn Vaughn RFVOH'[id number] Date: 07/06/2019   P1, Baby 17 hours old.  Mother states she tried breastfeeding and did not like it so for now she plans to formula feed. LC left lactation brochure in case mother has further questions.      Maternal Data    Feeding    LATCH Score                   Interventions    Lactation Tools Discussed/Used     Consult Status      [name] [facility] 07/06/2019, 12:01 PM

## 2019-07-06 NOTE — Progress Notes (Signed)
CSW received consult due to score 10 on [place] Depression Screen.    MOB sitting up in bed eating breakfast with infant asleep in basinet and FOB present at bedside, when CSW entered the room. MOB and FOB both very pleasant and engaged throughout [place] visit. CSW introduced self and explained reason for consult due to score of 10 on her [place]. MOB denied any concerns while filling out the depression screen and reported she was feeling good. MOB stated she has a history of anxiety attacks but reported she hasn't had one in almost 2 years. MOB denied any current mental health symptoms or mental health symptoms during her pregnancy but was open to education regarding the post-partum period. CSW provided education regarding Baby Blues vs PMADs and provided MOB with resources for mental health follow up.  CSW encouraged MOB to evaluate her mental health throughout the postpartum period with the use of the New Mom Checklist developed by Postpartum [facility] as well as the [place] Postnatal Depression Scale and notify a medical professional if symptoms arise.  MOB did not appear to be displaying any acute mental health symptoms and denied any current SI or HI. MOB reported having good support from FOB, her mother and FOB's father.  MOB and FOB confirmed having all essential items for infant once discharged and reported infant would be sleeping in a basinet once home. CSW provided review of Sudden Infant Death Syndrome (SIDS) precautions and safe sleeping habits.    CSW identifies no further need for intervention and no barriers to discharge at this time.  Elijio Miles, [place]  [facility] [employer] [phone]

## 2019-07-06 NOTE — Progress Notes (Addendum)
Post Partum Day 1 Subjective: Pt sitting in bed with her partner and baby-Kari. Cheerful and doing very well this morning post delivery yesterday. Attempted breast feeding however baby is not latching. Has been doing more formula. Has changed yellow pad 1-2 times over last 24 hrs. Denies passing clots.   Denies fevers, chest pain, SOB, palpitations, dizziness or abdo pain. No urinary symptoms. Has not had BM yet.   Objective: Blood pressure 124/84, pulse 86, temperature 98.1 F (36.7 C), resp. rate 18, height 5\' 1"  (1.549 m), weight 56.3 kg, last menstrual period 10/09/2018, SpO2 100 %, unknown if currently breastfeeding.  Physical Exam:  General: alert and cooperative  Cardiac: S1 and S2 present, no S3 or S4. No rubs or gallops Pulm: Lungs clear on auscultation, no crackles or wheeze. No respiratory distress. GI: Abdo soft non tender, no guarding. Bowel sounds present.  Lochia: appropriate Uterine Fundus: soft Incision: n/a DVT Evaluation: calves soft non tender, no peripheral edema   Recent Labs    07/05/19 0110 07/05/19 1331  HGB 10.5* 10.6*  HCT 31.9* 32.2*    Assessment/Plan: Plan for discharge tomorrow Pt would like nexplanon prior to discharge. Or Depot as second choice.   LOS: 1 day   Diante Barley 07/06/2019, 9:13 AM

## 2019-07-06 NOTE — Anesthesia Postprocedure Evaluation (Signed)
Anesthesia Post Note  Patient: Carolyn Vaughn  Procedure(s) Performed: AN AD HOC LABOR EPIDURAL     Patient location during evaluation: Mother Baby Anesthesia Type: Epidural Level of consciousness: awake Pain management: satisfactory to patient Vital Signs Assessment: post-procedure vital signs reviewed and stable Respiratory status: spontaneous breathing Cardiovascular status: stable Anesthetic complications: no    Last Vitals:  Vitals:   07/06/19 0015 07/06/19 0500  BP: 135/85 124/84  Pulse: 62 86  Resp: 18 18  Temp: 36.5 C 36.7 C  SpO2: 99% 100%    Last Pain:  Vitals:   07/06/19 1002  TempSrc:   PainSc: 0-No pain   Pain Goal:                   [employer]

## 2019-07-06 NOTE — Plan of Care (Signed)
Pts. Condition will continue to improve 

## 2019-07-06 NOTE — Discharge Instructions (Signed)

## 2019-07-07 DIAGNOSIS — Z30017 Encounter for initial prescription of implantable subdermal contraceptive: Secondary | ICD-10-CM

## 2019-07-07 MED ORDER — IBUPROFEN 600 MG PO TABS: 600.0000 mg | ORAL_TABLET | Freq: Four times a day (QID) | ORAL | 0 refills | Status: DC | PRN

## 2019-07-07 MED ORDER — ETONOGESTREL 68 MG ~~LOC~~ IMPL
68.0000 mg | DRUG_IMPLANT | Freq: Once | SUBCUTANEOUS | Status: AC
  Administered 2019-07-07: 68 mg via SUBCUTANEOUS
  Filled 2019-07-07: qty 1

## 2019-07-07 MED ORDER — LIDOCAINE HCL 1 % IJ SOLN
0.0000 mL | Freq: Once | INTRAMUSCULAR | Status: AC | PRN
  Administered 2019-07-07: 3 mL via INTRADERMAL
  Filled 2019-07-07: qty 20

## 2019-07-07 NOTE — Progress Notes (Signed)
Post Partum Day  Subjective: Pt doing very well. Looking forward to going home. Now bottle feeding which she prefers. Having intermittent abdo cramping which is tolerable. Now using normal sanitary pads for lochia. Not passing clots. No urinary symptoms. Having some loose stools after stool softner has been given and would like to stop this. Would like nexplanon but does not want this to delay her discharge today. Happy to use no birth control or get depot from health department.  No new concerns. She said she is waiting for Lona Kettle to be cleared from pediatrics.  Objective: Blood pressure 126/86, pulse 78, temperature 98 F (36.7 C), temperature source Oral, resp. rate 18, height 5\' 1"  (1.549 m), weight 56.3 kg, last menstrual period 10/09/2018, SpO2 100 %, unknown if currently breastfeeding.  Physical Exam:  General: well looking female, dress appropriately, comfortable, no distress Cardiac: S1 and S2 present, no S3 or S4, no rubs or gallops present Pulm: lungs clear on auscultation, no crackles or wheeze, no respiratory distress GI: Abdo soft non tender, bowel sounds present, no organomegaly Lochia: appropriate Uterine Fundus: soft Incision: n/a DVT Evaluation: No calf tenderness, no significant calf/ankle edema.  Recent Labs    07/05/19 0110 07/05/19 1331  HGB 10.5* 10.6*  HCT 31.9* 32.2*    Assessment/Plan: Medically stable for discharge 1) Nexplanon today 2) discharge home  3) Hb follow up with PCP   LOS: 2 days   Carolyn Vaughn 07/07/2019, 10:44 AM

## 2019-07-07 NOTE — Procedures (Signed)
Carolyn Vaughn 18 y.o. Vitals:   07/06/19 2155 07/07/19 0525  BP: 132/89 126/86  Pulse: 73 78  Resp: 16 18  Temp: 97.6 F (36.4 C) 98 F (36.7 C)  SpO2:  100%    Past Medical History:  Diagnosis Date  . Asthma     Family History  Problem Relation Age of Onset  . Diabetes Maternal Grandmother   . Hyperlipidemia Maternal Grandmother   . Emphysema Mother   . Diabetes Brother   . Hyperlipidemia Maternal Aunt   . Diabetes Maternal Aunt   . Diabetes Paternal Grandmother     Social History   Socioeconomic History  . Marital status: Single    Spouse name: Not on file  . Number of children: Not on file  . Years of education: Not on file  . Highest education level: Not on file  Occupational History  . Not on file  Social Needs  . Financial resource strain: Not on file  . Food insecurity    Worry: Not on file    Inability: Not on file  . Transportation needs    Medical: Not on file    Non-medical: Not on file  Tobacco Use  . Smoking status: Former Smoker    Types: Cigars    Quit date: 12/05/2018    Years since quitting: 0.5  . Smokeless tobacco: Never Used  Substance and Sexual Activity  . Alcohol use: No  . Drug use: No  . Sexual activity: Not Currently    Birth control/protection: None  Lifestyle  . Physical activity    Days per week: Not on file    Minutes per session: Not on file  . Stress: Not on file  Relationships  . Social Herbalist on phone: Not on file    Gets together: Not on file    Attends religious service: Not on file    Active member of club or organization: Not on file    Attends meetings of clubs or organizations: Not on file    Relationship status: Not on file  . Intimate partner violence    Fear of current or ex partner: Not on file    Emotionally abused: Not on file    Physically abused: Not on file    Forced sexual activity: Not on file  Other Topics Concern  . Not on file  Social History Narrative  . Not  on file    HPI:  Carolyn Vaughn is being seen per her request for Nexplanon insertion.  She was given informed consent for the procedure. A signed copy is in the chart.  Appropriate time out taken. Nexlanon site (L arm) identified and thea area was prepped in usual sterile fashon. 2 cc of 1% lidocaine was used to anesthetize the area after it was cleansed. The Nexplanon was inserted without difficulty.  A pressure bandage was applied.  Pt was instructed to remove pressure bandage in a few hours, and keep insertion site covered with a bandaid for 3 days.  Follow-up scheduled PRN problems  Exp: [id number] LOT: [id number]  Myrtis Ser, CNM 07/07/2019 10:44 PM

## 2019-07-10 ENCOUNTER — Encounter: Payer: Medicaid Other | Admitting: Obstetrics and Gynecology

## 2019-07-17 ENCOUNTER — Ambulatory Visit (INDEPENDENT_AMBULATORY_CARE_PROVIDER_SITE_OTHER): Payer: Medicaid Other | Admitting: *Deleted

## 2019-07-17 ENCOUNTER — Other Ambulatory Visit: Payer: Self-pay

## 2019-07-17 ENCOUNTER — Encounter: Payer: Medicaid Other | Admitting: Advanced Practice Midwife

## 2019-07-17 VITALS — BP 134/86 | HR 98

## 2019-07-17 DIAGNOSIS — O165 Unspecified maternal hypertension, complicating the puerperium: Secondary | ICD-10-CM

## 2019-07-17 NOTE — Progress Notes (Signed)
   Subjective:  Carolyn Vaughn is a 18 y.o. female here for BP check.   Hypertension ROS: no TIA's, no chest pain on exertion, no dyspnea on exertion, no swelling of ankles, no orthostatic dizziness or lightheadedness, no orthopnea or paroxysmal nocturnal dyspnea and no palpitations.    Objective:  BP 134/86 (BP Location: Right Arm, Patient Position: Sitting, Cuff Size: Normal)   Pulse 98   LMP 10/09/2018   Appearance alert, well appearing, and in no distress, oriented to person, place, and time and normal appearing weight. General exam BP noted to be well controlled today in office.    Assessment:   Blood Pressure well controlled, stable and asymptomatic.   Plan:  Current treatment plan is effective, no change in therapy.Hassell Done, Rosine Beat, RN

## 2019-08-03 ENCOUNTER — Ambulatory Visit: Payer: Medicaid Other | Admitting: Certified Nurse Midwife

## 2019-08-16 ENCOUNTER — Telehealth: Payer: Self-pay | Admitting: Certified Nurse Midwife

## 2020-02-09 ENCOUNTER — Encounter (HOSPITAL_COMMUNITY): Payer: Self-pay

## 2020-02-09 ENCOUNTER — Other Ambulatory Visit: Payer: Self-pay

## 2020-02-09 ENCOUNTER — Emergency Department (HOSPITAL_COMMUNITY)
Admission: EM | Admit: 2020-02-09 | Discharge: 2020-02-09 | Disposition: A | Discharge status: Home / Self Care | Payer: Medicaid Other | Attending: Emergency Medicine | Admitting: Emergency Medicine

## 2020-02-09 DIAGNOSIS — F410 Panic disorder [episodic paroxysmal anxiety] without agoraphobia: Secondary | ICD-10-CM | POA: Insufficient documentation

## 2020-02-09 DIAGNOSIS — J45909 Unspecified asthma, uncomplicated: Secondary | ICD-10-CM | POA: Insufficient documentation

## 2020-02-09 HISTORY — DX: Panic disorder (episodic paroxysmal anxiety): F41.0

## 2020-02-09 MED ORDER — ALBUTEROL SULFATE HFA 108 (90 BASE) MCG/ACT IN AERS
2.0000 | INHALATION_SPRAY | RESPIRATORY_TRACT | Status: DC
  Administered 2020-02-09: 2 via RESPIRATORY_TRACT
  Filled 2020-02-09: qty 6.7

## 2020-02-09 NOTE — ED Provider Notes (Signed)
[facility] DEPT Provider Note   CSN: [id number] Arrival date & time: 02/09/20  1453     History Chief Complaint  Patient presents with  . Panic Attack    ERNA DIOSDADO is a 19 y.o. female.  18 year old female who presents after having a panic attack due to bronchospasm.  Has history of asthma and states was driving a car arm and was having wheezing.  Did not have her inhaler.  Began to hyperventilate.  Symptoms gradually resolved on their own.  Notes increased stress recently.  Denies SI or HI.  Feels almost back to her baseline        Past Medical History:  Diagnosis Date  . Asthma   . Panic attack     Patient Active Problem List   Diagnosis Date Noted  . Gestational hypertension, third trimester 07/05/2019  . Chlamydia infection during pregnancy 04/26/2019  . Supervision of other normal pregnancy, antepartum 02/08/2019    Past Surgical History:  Procedure Laterality Date  . NO PAST SURGERIES       OB History    Gravida  2   Para  1   Term  1   Preterm      AB  1   Living  1     SAB      TAB  1   Ectopic      Multiple  0   Live Births  1           Family History  Problem Relation Age of Onset  . Diabetes Maternal Grandmother   . Hyperlipidemia Maternal Grandmother   . Emphysema Mother   . Diabetes Brother   . Hyperlipidemia Maternal Aunt   . Diabetes Maternal Aunt   . Diabetes Paternal Grandmother     Social History   Tobacco Use  . Smoking status: Former Smoker    Types: Cigars    Quit date: 12/05/2018    Years since quitting: 1.1  . Smokeless tobacco: Never Used  Substance Use Topics  . Alcohol use: No  . Drug use: No    Home Medications Prior to Admission medications   Medication Sig Start Date End Date Taking? Authorizing Provider  ibuprofen (ADVIL) 600 MG tablet Take 1 tablet (600 mg total) by mouth every 6 (six) hours as needed. 07/07/19   Myrtis Ser, CNM  Prenat  w/o A-FeCbn-Meth-FA-DHA (PRENATE MINI) 29-0.6-0.4-350 MG CAPS Take 1 capsule by mouth daily before breakfast. 05/24/19   Shelly Bombard, MD    Allergies    Patient has no known allergies.  Review of Systems   Review of Systems  All other systems reviewed and are negative.   Physical Exam Updated Vital Signs BP (!) 143/71 (BP Location: Left Arm)   Pulse 81   Temp 98.6 F (37 C) (Oral)   Resp 20   Ht 1.575 m (5\' 2" )   Wt 49.9 kg   LMP 02/05/2020   SpO2 100%   BMI 20.12 kg/m   Physical Exam Vitals and nursing note reviewed.  Constitutional:      General: She is not in acute distress.    Appearance: Normal appearance. She is well-developed. She is not toxic-appearing.  HENT:     Head: Normocephalic and atraumatic.  Eyes:     General: Lids are normal.     Conjunctiva/sclera: Conjunctivae normal.     Pupils: Pupils are equal, round, and reactive to light.  Neck:     Thyroid: No  thyroid mass.     Trachea: No tracheal deviation.  Cardiovascular:     Rate and Rhythm: Normal rate and regular rhythm.     Heart sounds: Normal heart sounds. No murmur. No gallop.   Pulmonary:     Effort: Pulmonary effort is normal. No respiratory distress.     Breath sounds: Normal breath sounds. No stridor. No decreased breath sounds, wheezing, rhonchi or rales.  Abdominal:     General: Bowel sounds are normal. There is no distension.     Palpations: Abdomen is soft.     Tenderness: There is no abdominal tenderness. There is no rebound.  Musculoskeletal:        General: No tenderness. Normal range of motion.     Cervical back: Normal range of motion and neck supple.  Skin:    General: Skin is warm and dry.     Findings: No abrasion or rash.  Neurological:     Mental Status: She is alert and oriented to person, place, and time.     GCS: GCS eye subscore is 4. GCS verbal subscore is 5. GCS motor subscore is 6.     Cranial Nerves: No cranial nerve deficit.     Sensory: No sensory deficit.    Psychiatric:        Attention and Perception: Attention normal.        Speech: Speech normal.        Behavior: Behavior normal.     ED Results / Procedures / Treatments   Labs (all labs ordered are listed, but only abnormal results are displayed) Labs Reviewed - No data to display  EKG None  Radiology No results found.  Procedures Procedures (including critical care time)  Medications Ordered in ED Medications - No data to display  ED Course  I have reviewed the triage vital signs and the nursing notes.  Pertinent labs & imaging results that were available during my care of the patient were reviewed by me and considered in my medical decision making (see chart for details).    MDM Rules/Calculators/A&P                      We will give patient albuterol MDI to go home with. Final Clinical Impression(s) / ED Diagnoses Final diagnoses:  None    Rx / DC Orders ED Discharge Orders    None       Lacretia Leigh, MD 02/09/20 1523

## 2020-02-09 NOTE — ED Triage Notes (Signed)
Patient reports that she had a panic attack approx 30 minutes ago. Patient states that she has a history of the same and the last being 12/20. Patient states she does not take any medication for the same. Patient tearful in triage.

## 2020-04-17 ENCOUNTER — Ambulatory Visit: Payer: Medicaid Other | Attending: Internal Medicine

## 2020-04-17 DIAGNOSIS — Z20822 Contact with and (suspected) exposure to covid-19: Secondary | ICD-10-CM

## 2020-04-19 ENCOUNTER — Encounter: Payer: Self-pay | Admitting: *Deleted

## 2020-08-20 ENCOUNTER — Other Ambulatory Visit: Payer: Self-pay

## 2020-08-20 ENCOUNTER — Ambulatory Visit (HOSPITAL_COMMUNITY)
Admission: RE | Admit: 2020-08-20 | Discharge: 2020-08-20 | Disposition: A | Discharge status: Home / Self Care | Payer: Medicaid Other | Source: Ambulatory Visit | Attending: Family Medicine | Admitting: Family Medicine

## 2020-08-20 ENCOUNTER — Encounter (HOSPITAL_COMMUNITY): Payer: Self-pay

## 2020-08-20 VITALS — BP 138/83 | HR 104 | Temp 99.9°F | Resp 18

## 2020-08-20 DIAGNOSIS — K0889 Other specified disorders of teeth and supporting structures: Secondary | ICD-10-CM

## 2020-08-20 MED ORDER — IBUPROFEN 600 MG PO TABS: 600.0000 mg | ORAL_TABLET | Freq: Three times a day (TID) | ORAL | 0 refills | Status: DC

## 2020-08-20 MED ORDER — CLINDAMYCIN HCL 300 MG PO CAPS: 300.0000 mg | ORAL_CAPSULE | Freq: Three times a day (TID) | ORAL | 0 refills | Status: DC

## 2020-08-20 NOTE — ED Triage Notes (Signed)
Patient in with c/o of left side mouth pain and notable facial swelling that started on Saturday. States pain started in gums then spread to teeth,face and neck.  Patient took nasal/throat spray, benadryl, throat lozenges yesterday with minimal relief   Denies fever or sob

## 2020-08-20 NOTE — ED Provider Notes (Signed)
[facility]   [id number] 08/20/20 Arrival Time: 2585  ASSESSMENT & PLAN:  1. Pain, dental     No sign of abscess requiring I&D at this time. Discussed.  Begin: Meds ordered this encounter  Medications  . clindamycin (CLEOCIN) 300 MG capsule    Sig: Take 1 capsule (300 mg total) by mouth 3 (three) times daily.    Dispense:  30 capsule    Refill:  0  . ibuprofen (ADVIL) 600 MG tablet    Sig: Take 1 tablet (600 mg total) by mouth 3 (three) times daily with meals.    Dispense:  21 tablet    Refill:  0   Dental resource written instructions given. She will schedule dental evaluation as soon as possible if not improving over the next 24-48 hours.  Reviewed expectations re: course of current medical issues. Questions answered. Outlined signs and symptoms indicating need for more acute intervention. Patient verbalized understanding. After Visit Summary given.   SUBJECTIVE:  Carolyn Vaughn is a 19 y.o. female who reports gradual onset of left upper dental pain described as aching/throbbing. Present for 2-3 days. Fever: absent. Tolerating PO intake but reports pain with chewing. Normal swallowing. She does not see a dentist regularly. No neck swelling or pain. OTC analgesics without relief. Decreased PO intake secondary to pain.   OBJECTIVE: Vitals:   08/20/20 1809  BP: 138/83  Pulse: (!) 104  Resp: 18  Temp: 99.9 F (37.7 C)  TempSrc: Oral  SpO2: 97%    Mild tachycardia noted. In pain.  General appearance: alert; no distress HENT: normocephalic; atraumatic; dentition: fair; left upper gums without areas of fluctuance, drainage, or bleeding and with reported pain; normal jaw movement but opens mouth very slowly Neck: supple without LAD; FROM; trachea midline Lungs: normal respirations; unlabored; speaks full sentences without difficulty Skin: warm and dry Psychological: alert and cooperative; normal mood and affect  No Known Allergies  Past  Medical History:  Diagnosis Date  . Asthma   . Panic attack    Social History   Socioeconomic History  . Marital status: Single    Spouse name: Not on file  . Number of children: Not on file  . Years of education: Not on file  . Highest education level: Not on file  Occupational History  . Not on file  Tobacco Use  . Smoking status: Former Smoker    Types: Cigars    Quit date: 12/05/2018    Years since quitting: 1.7  . Smokeless tobacco: Never Used  Vaping Use  . Vaping Use: Every day  . Substances: Nicotine, Flavoring  Substance and Sexual Activity  . Alcohol use: Yes  . Drug use: No  . Sexual activity: Not Currently    Birth control/protection: None  Other Topics Concern  . Not on file  Social History Narrative  . Not on file   Social Determinants of Health   Financial Resource Strain:   . Difficulty of Paying Living Expenses: Not on file  Food Insecurity:   . Worried About Charity fundraiser in the Last Year: Not on file  . Ran Out of Food in the Last Year: Not on file  Transportation Needs:   . Lack of Transportation (Medical): Not on file  . Lack of Transportation (Non-Medical): Not on file  Physical Activity:   . Days of Exercise per Week: Not on file  . Minutes of Exercise per Session: Not on file  Stress:   . Feeling of  Stress : Not on file  Social Connections:   . Frequency of Communication with Friends and Family: Not on file  . Frequency of Social Gatherings with Friends and Family: Not on file  . Attends Religious Services: Not on file  . Active Member of Clubs or Organizations: Not on file  . Attends Archivist Meetings: Not on file  . Marital Status: Not on file  Intimate Partner Violence:   . Fear of Current or Ex-Partner: Not on file  . Emotionally Abused: Not on file  . Physically Abused: Not on file  . Sexually Abused: Not on file   Family History  Problem Relation Age of Onset  . Diabetes Maternal Grandmother   .  Hyperlipidemia Maternal Grandmother   . Emphysema Mother   . Diabetes Brother   . Hyperlipidemia Maternal Aunt   . Diabetes Maternal Aunt   . Diabetes Paternal Grandmother    Past Surgical History:  Procedure Laterality Date  . NO PAST SURGERIES       Vanessa Kick, MD 08/20/20 [phone]

## 2022-01-22 ENCOUNTER — Ambulatory Visit: Payer: Medicaid Other | Admitting: Podiatry

## 2022-01-30 ENCOUNTER — Ambulatory Visit: Payer: Medicaid Other

## 2022-02-27 ENCOUNTER — Ambulatory Visit: Payer: Medicaid Other | Admitting: Podiatry

## 2022-07-09 ENCOUNTER — Ambulatory Visit: Payer: Medicaid Other | Admitting: Obstetrics and Gynecology

## 2022-09-07 ENCOUNTER — Ambulatory Visit: Payer: Medicaid Other | Admitting: Advanced Practice Midwife

## 2022-09-07 NOTE — Progress Notes (Deleted)
GYNECOLOGY CLINIC PROCEDURE NOTE  Carolyn Vaughn is a 21 y.o. G2P1011 here for Nexplanon removal*** Nexplanon insertion.  Last pap smear was on *** and was normal.  No other gynecologic concerns.  Nexplanon Removal Patient identified, informed consent performed, consent signed.   Appropriate time out taken. Nexplanon site identified.  Area prepped in usual sterile fashon. One ml of 1% lidocaine was used to anesthetize the area at the distal end of the implant. A small stab incision was made right beside the implant on the distal portion.  The Nexplanon rod was grasped using hemostats and removed without difficulty.  There was minimal blood loss. There were no complications.  3 ml of 1% lidocaine was injected around the incision for post-procedure analgesia.  Steri-strips were applied over the small incision.  A pressure bandage was applied to reduce any bruising.  The patient tolerated the procedure well and was given post procedure instructions.  Patient is planning to use *** for contraception/attempt conception.  No follow-ups on file.   Fatima Blank, CNM 8:12 AM

## 2022-11-02 ENCOUNTER — Emergency Department (HOSPITAL_COMMUNITY)
Admission: EM | Admit: 2022-11-02 | Discharge: 2022-11-02 | Disposition: A | Discharge status: Home / Self Care | Payer: Medicaid Other | Attending: Emergency Medicine | Admitting: Emergency Medicine

## 2022-11-02 DIAGNOSIS — R109 Unspecified abdominal pain: Secondary | ICD-10-CM | POA: Diagnosis not present

## 2022-11-02 DIAGNOSIS — R112 Nausea with vomiting, unspecified: Secondary | ICD-10-CM | POA: Diagnosis present

## 2022-11-02 LAB — COMPREHENSIVE METABOLIC PANEL
ALT: 20 U/L (ref 0–44)
AST: 18 U/L (ref 15–41)
Albumin: 4 g/dL (ref 3.5–5.0)
Alkaline Phosphatase: 55 U/L (ref 38–126)
Anion gap: 9 (ref 5–15)
BUN: 13 mg/dL (ref 6–20)
CO2: 20 mmol/L — ABNORMAL LOW (ref 22–32)
Calcium: 8.8 mg/dL — ABNORMAL LOW (ref 8.9–10.3)
Chloride: 106 mmol/L (ref 98–111)
Creatinine, Ser: 0.63 mg/dL (ref 0.44–1.00)
GFR, Estimated: >60 mL/min (ref >60)
Glucose, Bld: 90 mg/dL (ref 70–99)
Potassium: 3.8 mmol/L (ref 3.5–5.1)
Sodium: 135 mmol/L (ref 135–145)
Total Bilirubin: 1.6 mg/dL — ABNORMAL HIGH (ref 0.3–1.2)
Total Protein: 7.6 g/dL (ref 6.5–8.1)

## 2022-11-02 LAB — CBC
HCT: 42.3 % (ref 36.0–46.0)
Hemoglobin: 14.4 g/dL (ref 12.0–15.0)
MCH: 31 pg (ref 26.0–34.0)
MCHC: 34 g/dL (ref 30.0–36.0)
MCV: 91.2 fL (ref 80.0–100.0)
Platelets: 304 10*3/uL (ref 150–400)
RBC: 4.64 MIL/uL (ref 3.87–5.11)
RDW: 12.5 % (ref 11.5–15.5)
WBC: 6.6 10*3/uL (ref 4.0–10.5)
nRBC: 0 % (ref 0.0–0.2)

## 2022-11-02 LAB — LIPASE, BLOOD: Lipase: 27 U/L (ref 11–51)

## 2022-11-02 MED ORDER — FAMOTIDINE 20 MG PO TABS
20.0000 mg | ORAL_TABLET | Freq: Once | ORAL | Status: AC
  Administered 2022-11-02: 20 mg via ORAL
  Filled 2022-11-02: qty 1

## 2022-11-02 MED ORDER — OMEPRAZOLE 20 MG PO CPDR: 20.0000 mg | DELAYED_RELEASE_CAPSULE | Freq: Every day | ORAL | 0 refills | Status: DC

## 2022-11-02 MED ORDER — ONDANSETRON 4 MG PO TBDP
4.0000 mg | ORAL_TABLET | Freq: Once | ORAL | Status: AC | PRN
  Administered 2022-11-02: 4 mg via ORAL
  Filled 2022-11-02: qty 1

## 2022-11-02 MED ORDER — ONDANSETRON HCL 4 MG PO TABS
4.0000 mg | ORAL_TABLET | Freq: Four times a day (QID) | ORAL | 0 refills | Status: AC
Start: 2022-11-02 — End: ?

## 2022-11-02 MED ORDER — ALUM & MAG HYDROXIDE-SIMETH 200-200-20 MG/5ML PO SUSP
30.0000 mL | Freq: Once | ORAL | Status: AC
  Administered 2022-11-02: 30 mL via ORAL
  Filled 2022-11-02: qty 30

## 2022-11-02 MED ORDER — DICYCLOMINE HCL 20 MG PO TABS
20.0000 mg | ORAL_TABLET | Freq: Two times a day (BID) | ORAL | 0 refills | Status: AC | PRN
Start: 2022-11-02 — End: ?

## 2022-11-02 NOTE — Discharge Instructions (Signed)
Likely you have inflammation of your stomach, given new Bentyl for stomach spasms, Zofran for nausea, Prilosec for acid reflux recommend a bland diet and transition to a normal diet as your symptoms improve  Follow-up with PCP as needed  If you note that you are having worsening symptoms, uncontrolled nausea vomiting, severe abdominal pain coupled with fevers please come back in for reassessment.

## 2022-11-02 NOTE — ED Notes (Signed)
Patient attempted to provide urine sample. Very small amount provided.  Unable to send to lab.

## 2022-11-02 NOTE — ED Provider Notes (Signed)
[facility] DEPT Provider Note   CSN: [id number] Arrival date & time: 11/02/22  7680     History  Chief Complaint  Patient presents with   Emesis    Carolyn Vaughn is a 21 y.o. female.  HPI   Patient without medical history presenting with abdominal pain nausea vomiting.  Patient states that started yesterday afternoon, states that happened after she ate some leftovers from last night.  She feels that she might have food poisoning, states that she has been unable to tolerate p.o., has had multiple episodes of vomiting denies bloody emesis or coffee-ground emesis, she still passing gas have normal bowel movements, no abdominal surgeries, denies excessive alcohol use or NSAID use, she denies urinary symptoms, currently Nexplanon for birth control, no associated fevers chills cough congestion general body aches.    Home Medications Prior to Admission medications   Medication Sig Start Date End Date Taking? Authorizing Provider  dicyclomine (BENTYL) 20 MG tablet Take 1 tablet (20 mg total) by mouth 2 (two) times daily as needed for spasms. 11/02/22  Yes Marcello Fennel, PA-C  omeprazole (PRILOSEC) 20 MG capsule Take 1 capsule (20 mg total) by mouth daily. 11/02/22 12/02/22 Yes Marcello Fennel, PA-C  ondansetron (ZOFRAN) 4 MG tablet Take 1 tablet (4 mg total) by mouth every 6 (six) hours. 11/02/22  Yes Marcello Fennel, PA-C  clindamycin (CLEOCIN) 300 MG capsule Take 1 capsule (300 mg total) by mouth 3 (three) times daily. 08/20/20   Vanessa Kick, MD  ibuprofen (ADVIL) 600 MG tablet Take 1 tablet (600 mg total) by mouth 3 (three) times daily with meals. 08/20/20   Vanessa Kick, MD      Allergies    Patient has no known allergies.    Review of Systems   Review of Systems  Constitutional:  Negative for chills and fever.  Respiratory:  Negative for shortness of breath.   Cardiovascular:  Negative for chest pain.  Gastrointestinal:   Positive for abdominal pain, nausea and vomiting. Negative for diarrhea.  Neurological:  Negative for headaches.    Physical Exam Updated Vital Signs BP 129/87 (BP Location: Left Arm)   Pulse (!) 109   Temp 98 F (36.7 C) (Oral)   Resp 17   SpO2 93%  Physical Exam Vitals and nursing note reviewed.  Constitutional:      General: She is not in acute distress.    Appearance: She is not ill-appearing.  HENT:     Head: Normocephalic and atraumatic.     Nose: No congestion.  Eyes:     Conjunctiva/sclera: Conjunctivae normal.  Cardiovascular:     Rate and Rhythm: Normal rate and regular rhythm.     Pulses: Normal pulses.     Heart sounds: No murmur heard.    No friction rub. No gallop.  Pulmonary:     Effort: No respiratory distress.     Breath sounds: No wheezing, rhonchi or rales.  Abdominal:     Palpations: Abdomen is soft.     Tenderness: There is abdominal tenderness. There is no right CVA tenderness or left CVA tenderness.     Comments: Abdomen nondistended, soft, she had slight tenderness on the epigastric region without guarding or presents or peritoneal sign.  No flank tenderness  Skin:    General: Skin is warm and dry.  Neurological:     Mental Status: She is alert.  Psychiatric:        Mood and Affect: Mood normal.  ED Results / Procedures / Treatments   Labs (all labs ordered are listed, but only abnormal results are displayed) Labs Reviewed  COMPREHENSIVE METABOLIC PANEL - Abnormal; Notable for the following components:      Result Value   CO2 20 (*)    Calcium 8.8 (*)    Total Bilirubin 1.6 (*)    All other components within normal limits  LIPASE, BLOOD  CBC  URINALYSIS, ROUTINE W REFLEX MICROSCOPIC  PREGNANCY, URINE    EKG None  Radiology No results found.  Procedures Procedures    Medications Ordered in ED Medications  ondansetron (ZOFRAN-ODT) disintegrating tablet 4 mg (4 mg Oral Given 11/02/22 0224)  famotidine (PEPCID) tablet 20  mg (20 mg Oral Given 11/02/22 0403)  alum & mag hydroxide-simeth (MAALOX/MYLANTA) 200-200-20 MG/5ML suspension 30 mL (30 mLs Oral Given 11/02/22 0403)    ED Course/ Medical Decision Making/ A&P                           Medical Decision Making Amount and/or Complexity of Data Reviewed Labs: ordered.  Risk OTC drugs. Prescription drug management.   This patient presents to the ED for concern of abdominal pain, this involves an extensive number of treatment options, and is a complaint that carries with it a high risk of complications and morbidity.  The differential diagnosis includes gastritis, cholecystitis, diverticulitis, volvulus    Additional history obtained:  Additional history obtained from N/A External records from outside source obtained and reviewed including previous ER notes   Co morbidities that complicate the patient evaluation  N/A  Social Determinants of Health:  N/A    Lab Tests:  I Ordered, and personally interpreted labs.  The pertinent results include: CBC is unremarkable   Imaging Studies ordered:  I ordered imaging studies including N/A I independently visualized and interpreted imaging which showed N/A I agree with the radiologist interpretation   Cardiac Monitoring:  The patient was maintained on a cardiac monitor.  I personally viewed and interpreted the cardiac monitored which showed an underlying rhythm of: n/a   Medicines ordered and prescription drug management:  I ordered medication including GI cocktail, Pepcid, Zofran I have reviewed the patients home medicines and have made adjustments as needed  Critical Interventions:  N/a   Reevaluation:  Abdominal pain, benign physical exam, will check lab work prior with medications and reassess.  Patient was reassessed resting comfortably, she is tolerating p.o. without difficulty, abdomen remains soft nontender, she is agreement discharge at this time.    Consultations  Obtained:  N/a    Test Considered:  CT ab pelvis-deferred she has a nonsurgical abdomen nontoxic-appearing.    Rule out low suspicion for lower lobe pneumonia as lung sounds are clear bilaterally, will defer imaging at this time.  I have low suspicion for liver or gallbladder abnormality as she has no right upper quadrant tenderness, liver enzymes, alk phos, T bili all within normal limits.  Low suspicion for pancreatitis as lipase is within normal limits.  Low suspicion for ruptured stomach ulcer as she has no peritoneal sign present on exam.  Low suspicion for bowel obstruction as abdomen is nondistended normal bowel sounds, so passing gas and having normal bowel movements.  Low suspicion for complicated diverticulitis as she is nontoxic-appearing, vital signs reassuring no leukocytosis present.  Low suspicion for appendicitis as she has no right lower quadrant tenderness, vital signs reassuring.  I have low suspicion for intra-abdominal infection as  she has low risk factors, vital signs reassuring, no leukocytosis, will defer imaging at this time.     Dispostion and problem list  After consideration of the diagnostic results and the patients response to treatment, I feel that the patent would benefit from discharge.  Nausea vomiting-suspect gastritis passing from food poisoning, will provide her with Zofran, PPI, Bentyl follow-up as needed.  Strict return precautions            Final Clinical Impression(s) / ED Diagnoses Final diagnoses:  Nausea and vomiting, unspecified vomiting type    Rx / DC Orders ED Discharge Orders          Ordered    ondansetron (ZOFRAN) 4 MG tablet  Every 6 hours        11/02/22 0503    dicyclomine (BENTYL) 20 MG tablet  2 times daily PRN        11/02/22 0503    omeprazole (PRILOSEC) 20 MG capsule  Daily        11/02/22 0503              Marcello Fennel, PA-C 11/02/22 0505    Fatima Blank, MD 11/02/22 [phone]

## 2022-11-02 NOTE — ED Triage Notes (Signed)
Pt reports vomiting/diarrhea and cold sweats, onset around 1300 yesterday.

## 2023-02-17 ENCOUNTER — Encounter (HOSPITAL_COMMUNITY): Payer: Self-pay

## 2023-02-17 ENCOUNTER — Ambulatory Visit (HOSPITAL_COMMUNITY)
Admission: EM | Admit: 2023-02-17 | Discharge: 2023-02-17 | Disposition: A | Discharge status: Home / Self Care | Payer: Medicaid Other | Attending: Internal Medicine | Admitting: Internal Medicine

## 2023-02-17 DIAGNOSIS — Z3202 Encounter for pregnancy test, result negative: Secondary | ICD-10-CM | POA: Diagnosis present

## 2023-02-17 DIAGNOSIS — N898 Other specified noninflammatory disorders of vagina: Secondary | ICD-10-CM | POA: Diagnosis present

## 2023-02-17 DIAGNOSIS — R102 Pelvic and perineal pain: Secondary | ICD-10-CM | POA: Insufficient documentation

## 2023-02-17 LAB — POCT URINALYSIS DIPSTICK, ED / UC
Bilirubin Urine: NEGATIVE
Glucose, UA: NEGATIVE mg/dL
Hgb urine dipstick: NEGATIVE
Ketones, ur: NEGATIVE mg/dL
Leukocytes,Ua: NEGATIVE
Nitrite: NEGATIVE
Protein, ur: NEGATIVE mg/dL
Specific Gravity, Urine: 1.02 (ref 1.005–1.030)
Urobilinogen, UA: 0.2 mg/dL (ref 0.0–1.0)
pH: 7 (ref 5.0–8.0)

## 2023-02-17 LAB — POC URINE PREG, ED: Preg Test, Ur: NEGATIVE

## 2023-02-17 MED ORDER — FLUCONAZOLE 150 MG PO TABS: 150.0000 mg | ORAL_TABLET | ORAL | 0 refills | Status: DC

## 2023-02-17 NOTE — ED Notes (Signed)
Reviewed work note 

## 2023-02-17 NOTE — ED Triage Notes (Signed)
Pt is here for pelvic   and abdominal pain  since last night. Swollen lymph nodes  in groin area tender to touch . Pt has not took any OTC meds.

## 2023-02-17 NOTE — ED Provider Notes (Signed)
Carolyn Vaughn    CSN: [id number] Arrival date & time: 02/17/23  [id number]      History   Chief Complaint Chief Complaint  Patient presents with   Abdominal Pain    HPI Carolyn Vaughn is a 22 y.o. female.   Patient presents to urgent care for evaluation of bilateral lower abdominal pain, bladder pressure with urination, lymph node swelling to the bilateral groin region, and vaginal itching that started last night.  No recent new sexual partners or concern for STD.  She has been in a monogamous relationship with her female partner for 7 years.  She cannot identify any triggering or relieving factors for abdominal discomfort and has not attempted use of any over-the-counter medications to help with symptoms.  She reports frequent vaginal yeast infections, most recently treated for yeast vaginitis greater than 3 months ago.  Denies dysuria, urinary frequency, urinary urgency, and fever/chills.  No history of abdominal surgeries, recent antibiotic/steroid use diarrhea, constipation, blood/mucus in the stools, flank pain, nausea, vomiting, body aches, or viral URI symptoms.  Denies discharge, vaginal odor, and vaginal rash.  No history of HSV-2.  No history of pelvic inflammatory disease.  Has not attempted use of any over-the-counter medications before coming to urgent care.   Abdominal Pain   Past Medical History:  Diagnosis Date   Asthma    Panic attack     Patient Active Problem List   Diagnosis Date Noted   Gestational hypertension, third trimester 07/05/2019   Chlamydia infection during pregnancy 04/26/2019   Supervision of other normal pregnancy, antepartum 02/08/2019    Past Surgical History:  Procedure Laterality Date   NO PAST SURGERIES      OB History     Gravida  2   Para  1   Term  1   Preterm      AB  1   Living  1      SAB      IAB  1   Ectopic      Multiple  0   Live Births  1            Home Medications    Prior to  Admission medications   Medication Sig Start Date End Date Taking? Authorizing Provider  fluconazole (DIFLUCAN) 150 MG tablet Take 1 tablet (150 mg total) by mouth every 3 (three) days. 02/17/23  Yes Talbot Grumbling, FNP  clindamycin (CLEOCIN) 300 MG capsule Take 1 capsule (300 mg total) by mouth 3 (three) times daily. 08/20/20   Vanessa Kick, MD  dicyclomine (BENTYL) 20 MG tablet Take 1 tablet (20 mg total) by mouth 2 (two) times daily as needed for spasms. 11/02/22   Marcello Fennel, PA-C  ibuprofen (ADVIL) 600 MG tablet Take 1 tablet (600 mg total) by mouth 3 (three) times daily with meals. 08/20/20   Vanessa Kick, MD  omeprazole (PRILOSEC) 20 MG capsule Take 1 capsule (20 mg total) by mouth daily. 11/02/22 12/02/22  Marcello Fennel, PA-C  ondansetron (ZOFRAN) 4 MG tablet Take 1 tablet (4 mg total) by mouth every 6 (six) hours. 11/02/22   Marcello Fennel, PA-C    Family History Family History  Problem Relation Age of Onset   Diabetes Maternal Grandmother    Hyperlipidemia Maternal Grandmother    Emphysema Mother    Diabetes Brother    Hyperlipidemia Maternal Aunt    Diabetes Maternal Aunt    Diabetes Paternal Grandmother     Social History Social  History   Tobacco Use   Smoking status: Former    Types: Cigars    Quit date: 12/05/2018    Years since quitting: 4.2   Smokeless tobacco: Never  Vaping Use   Vaping Use: Every day   Substances: Nicotine, Flavoring  Substance Use Topics   Alcohol use: Yes   Drug use: No     Allergies   Patient has no known allergies.   Review of Systems Review of Systems  Gastrointestinal:  Positive for abdominal pain.  Per HPI   Physical Exam Triage Vital Signs ED Triage Vitals  Enc Vitals Group     BP 02/17/23 1103 117/76     Pulse Rate 02/17/23 1103 83     Resp 02/17/23 1103 12     Temp 02/17/23 1103 98.1 F (36.7 C)     Temp src --      SpO2 02/17/23 1103 98 %     Weight --      Height --      Head  Circumference --      Peak Flow --      Pain Score 02/17/23 1104 6     Pain Loc --      Pain Edu? --      Excl. in [place]? --    No data found.  Updated Vital Signs BP 117/76 (BP Location: Right Arm)   Pulse 83   Temp 98.1 F (36.7 C)   Resp 12   LMP 12/30/2022   SpO2 98%   Visual Acuity Right Eye Distance:   Left Eye Distance:   Bilateral Distance:    Right Eye Near:   Left Eye Near:    Bilateral Near:     Physical Exam Vitals and nursing note reviewed.  Constitutional:      Appearance: She is not ill-appearing or toxic-appearing.  HENT:     Head: Normocephalic and atraumatic.     Right Ear: Hearing and external ear normal.     Left Ear: Hearing and external ear normal.     Nose: Nose normal.     Mouth/Throat:     Lips: Pink.     Mouth: Mucous membranes are moist. No injury.     Tongue: No lesions. Tongue does not deviate from midline.     Palate: No mass and lesions.     Pharynx: Oropharynx is clear. Uvula midline. No pharyngeal swelling, oropharyngeal exudate, posterior oropharyngeal erythema or uvula swelling.     Tonsils: No tonsillar exudate or tonsillar abscesses.  Eyes:     General: Lids are normal. Vision grossly intact. Gaze aligned appropriately.     Extraocular Movements: Extraocular movements intact.     Conjunctiva/sclera: Conjunctivae normal.  Cardiovascular:     Rate and Rhythm: Normal rate and regular rhythm.     Heart sounds: Normal heart sounds, S1 normal and S2 normal.  Pulmonary:     Effort: Pulmonary effort is normal. No respiratory distress.     Breath sounds: Normal breath sounds and air entry.  Abdominal:     General: Bowel sounds are normal.     Palpations: Abdomen is soft.     Tenderness: There is abdominal tenderness in the right lower quadrant, suprapubic area and left lower quadrant. There is no right CVA tenderness, left CVA tenderness or guarding.  Musculoskeletal:     Cervical back: Neck supple.  Skin:    General: Skin is warm  and dry.     Capillary Refill: Capillary refill takes  less than 2 seconds.     Findings: No rash.  Neurological:     General: No focal deficit present.     Mental Status: She is alert and oriented to person, place, and time. Mental status is at baseline.     Cranial Nerves: No dysarthria or facial asymmetry.  Psychiatric:        Mood and Affect: Mood normal.        Speech: Speech normal.        Behavior: Behavior normal.        Thought Content: Thought content normal.        Judgment: Judgment normal.      UC Treatments / Results  Labs (all labs ordered are listed, but only abnormal results are displayed) Labs Reviewed  POCT URINALYSIS DIPSTICK, ED / UC  POC URINE PREG, ED  CERVICOVAGINAL ANCILLARY ONLY    EKG   Radiology No results found.  Procedures Procedures (including critical care time)  Medications Ordered in UC Medications - No data to display  Initial Impression / Assessment and Plan / UC Course  I have reviewed the triage vital signs and the nursing notes.  Pertinent labs & imaging results that were available during my care of the patient were reviewed by me and considered in my medical decision making (see chart for details).   1. Pelvic pain, vaginal itching, negative pregnancy Urinalysis is negative for signs of urinary tract infection.  Urine pregnancy is negative.  Plan to treat for yeast vaginitis due to history of same and similar presentation.  Diflucan sent to pharmacy to be taken as prescribed.  Will treat for all other STDs based on results of vaginal swab.  Patient declines HIV and RPR testing today.  May use Tylenol/ibuprofen as needed for abdominal discomfort related to likely vaginitis infection.  Abdominal exam is without peritoneal signs, therefore deferred imaging/further workup in the emergency department.  Advised to follow-up with OB/GYN for ongoing evaluation and management of pelvic pain.   Discussed physical exam and available lab work  findings in clinic with patient.  Counseled patient regarding appropriate use of medications and potential side effects for all medications recommended or prescribed today. Discussed red flag signs and symptoms of worsening condition,when to call the PCP office, return to urgent care, and when to seek higher level of care in the emergency department. Patient verbalizes understanding and agreement with plan. All questions answered. Patient discharged in stable condition.    Final Clinical Impressions(s) / UC Diagnoses   Final diagnoses:  Pelvic pain in female  Vaginal itching  Negative pregnancy test     Discharge Instructions      You were tested today for STDs and the results are still pending; you have been given treatment for possible infections presumptively anyway due to your symptoms. You will receive a phone call in approximately 3 days if the results are positive. You should follow up with your primary care provider for further STI testing.  Diflucan once today then again in 3 days to treat likely possible yeast vaginitis.  Pregnancy is negative. Urine is negative.  We will treat for any other positive results when your labs come back. If you do not hear from [place], this means that your STI testing was negative or there is no change to your treatment plan. You will also receive these results via [place].   Return if you experience fevers 100.4 or greater, worsening or uncontrolled pain, rashes, sores, vomiting, or for any other  concerning symptoms.       ED Prescriptions     Medication Sig Dispense Auth. Provider   fluconazole (DIFLUCAN) 150 MG tablet Take 1 tablet (150 mg total) by mouth every 3 (three) days. 2 tablet Talbot Grumbling, FNP      PDMP not reviewed this encounter.   Talbot Grumbling, [place] 02/17/23 1148

## 2023-02-17 NOTE — Discharge Instructions (Signed)
You were tested today for STDs and the results are still pending; you have been given treatment for possible infections presumptively anyway due to your symptoms. You will receive a phone call in approximately 3 days if the results are positive. You should follow up with your primary care provider for further STI testing.  Diflucan once today then again in 3 days to treat likely possible yeast vaginitis.  Pregnancy is negative. Urine is negative.  We will treat for any other positive results when your labs come back. If you do not hear from [place], this means that your STI testing was negative or there is no change to your treatment plan. You will also receive these results via [place].   Return if you experience fevers 100.4 or greater, worsening or uncontrolled pain, rashes, sores, vomiting, or for any other concerning symptoms.

## 2023-02-18 ENCOUNTER — Telehealth (HOSPITAL_COMMUNITY): Payer: Self-pay

## 2023-02-18 LAB — CERVICOVAGINAL ANCILLARY ONLY
Bacterial Vaginitis (gardnerella): POSITIVE — AB
Candida Glabrata: NEGATIVE
Candida Vaginitis: NEGATIVE
Chlamydia: NEGATIVE
Comment: NEGATIVE
Comment: NEGATIVE
Comment: NEGATIVE
Comment: NEGATIVE
Comment: NEGATIVE
Comment: NORMAL
Neisseria Gonorrhea: NEGATIVE
Trichomonas: NEGATIVE

## 2023-02-18 MED ORDER — METRONIDAZOLE 500 MG PO TABS: 500.0000 mg | ORAL_TABLET | Freq: Two times a day (BID) | ORAL | 0 refills | Status: DC

## 2023-02-24 ENCOUNTER — Other Ambulatory Visit (HOSPITAL_COMMUNITY)
Admission: RE | Admit: 2023-02-24 | Discharge: 2023-02-24 | Disposition: A | Discharge status: Home / Self Care | Payer: Medicaid Other | Source: Ambulatory Visit | Attending: Obstetrics | Admitting: Obstetrics

## 2023-02-24 ENCOUNTER — Ambulatory Visit (INDEPENDENT_AMBULATORY_CARE_PROVIDER_SITE_OTHER): Payer: Medicaid Other | Admitting: Obstetrics

## 2023-02-24 ENCOUNTER — Encounter: Payer: Self-pay | Admitting: Obstetrics

## 2023-02-24 VITALS — BP 118/77 | HR 73 | Ht 61.0 in | Wt 105.9 lb

## 2023-02-24 DIAGNOSIS — R102 Pelvic and perineal pain: Secondary | ICD-10-CM

## 2023-02-24 DIAGNOSIS — Z01419 Encounter for gynecological examination (general) (routine) without abnormal findings: Secondary | ICD-10-CM | POA: Insufficient documentation

## 2023-02-24 DIAGNOSIS — Z30011 Encounter for initial prescription of contraceptive pills: Secondary | ICD-10-CM | POA: Diagnosis not present

## 2023-02-24 DIAGNOSIS — E569 Vitamin deficiency, unspecified: Secondary | ICD-10-CM | POA: Diagnosis not present

## 2023-02-24 DIAGNOSIS — Z3009 Encounter for other general counseling and advice on contraception: Secondary | ICD-10-CM | POA: Diagnosis not present

## 2023-02-24 DIAGNOSIS — N946 Dysmenorrhea, unspecified: Secondary | ICD-10-CM

## 2023-02-24 LAB — POCT URINE PREGNANCY: Preg Test, Ur: NEGATIVE

## 2023-02-24 MED ORDER — LEVONORGESTREL-ETHINYL ESTRAD 0.15-30 MG-MCG PO TABS: 1.0000 | ORAL_TABLET | Freq: Every day | ORAL | 11 refills | Status: DC

## 2023-02-24 MED ORDER — VITAFOL ULTRA 29-0.6-0.4-200 MG PO CAPS: 1.0000 | ORAL_CAPSULE | Freq: Every day | ORAL | 4 refills | Status: DC

## 2023-02-24 MED ORDER — IBUPROFEN 800 MG PO TABS: 800.0000 mg | ORAL_TABLET | Freq: Three times a day (TID) | ORAL | 5 refills | Status: DC | PRN

## 2023-02-24 NOTE — Progress Notes (Unsigned)
Subjective:        Carolyn Vaughn is a 22 y.o. female here for a routine exam.  Current complaints: Irregular menstrual cycles, pelvic pain and vaginal discharge.  Contraception:  Nexplanon  Personal health questionnaire:  Is patient Ashkenazi Jewish, have a family history of breast and/or ovarian cancer: no Is there a family history of uterine cancer diagnosed at age < 14, gastrointestinal cancer, urinary tract cancer, family member who is a Personnel officer syndrome-associated carrier: no Is the patient overweight and hypertensive, family history of diabetes, personal history of gestational diabetes, preeclampsia or PCOS: no Is patient over 62, have PCOS,  family history of premature CHD under age 15, diabetes, smoke, have hypertension or peripheral artery disease:  no At any time, has a partner hit, kicked or otherwise hurt or frightened you?: no Over the past 2 weeks, have you felt down, depressed or hopeless?: no Over the past 2 weeks, have you felt little interest or pleasure in doing things?:no   Gynecologic History Patient's last menstrual period was 12/30/2022. Contraception: Depo-Provera injections Last Pap: none. Results were: none Last mammogram: n/a. Results were: n/a  Obstetric History OB History  Gravida Para Term Preterm AB Living  2 1 1   1 1   SAB IAB Ectopic Multiple Live Births    1   0 1    # Outcome Date GA Lbr Len/2nd Weight Sex Delivery Anes PTL Lv  2 Term 07/05/19 [redacted]w[redacted]d 06:48 / 00:21 6 lb 0.7 oz (2.74 kg) F Vag-Spont EPI  LIV  1 IAB             Past Medical History:  Diagnosis Date   Asthma    Panic attack     Past Surgical History:  Procedure Laterality Date   NO PAST SURGERIES       Current Outpatient Medications:    fluconazole (DIFLUCAN) 150 MG tablet, Take 1 tablet (150 mg total) by mouth every 3 (three) days., Disp: 2 tablet, Rfl: 0   ibuprofen (ADVIL) 800 MG tablet, Take 1 tablet (800 mg total) by mouth every 8 (eight) hours as  needed., Disp: 30 tablet, Rfl: 5   levonorgestrel-ethinyl estradiol (NORDETTE) 0.15-30 MG-MCG tablet, Take 1 tablet by mouth daily., Disp: 28 tablet, Rfl: 11   metroNIDAZOLE (FLAGYL) 500 MG tablet, Take 1 tablet (500 mg total) by mouth 2 (two) times daily., Disp: 14 tablet, Rfl: 0   Prenat-Fe Poly-Methfol-FA-DHA (VITAFOL ULTRA) 29-0.6-0.4-200 MG CAPS, Take 1 capsule by mouth daily before breakfast., Disp: 90 capsule, Rfl: 4   clindamycin (CLEOCIN) 300 MG capsule, Take 1 capsule (300 mg total) by mouth 3 (three) times daily. (Patient not taking: Reported on 02/24/2023), Disp: 30 capsule, Rfl: 0   dicyclomine (BENTYL) 20 MG tablet, Take 1 tablet (20 mg total) by mouth 2 (two) times daily as needed for spasms. (Patient not taking: Reported on 02/24/2023), Disp: 20 tablet, Rfl: 0   omeprazole (PRILOSEC) 20 MG capsule, Take 1 capsule (20 mg total) by mouth daily., Disp: 30 capsule, Rfl: 0   ondansetron (ZOFRAN) 4 MG tablet, Take 1 tablet (4 mg total) by mouth every 6 (six) hours. (Patient not taking: Reported on 02/24/2023), Disp: 12 tablet, Rfl: 0 No Known Allergies  Social History   Tobacco Use   Smoking status: Former    Types: Cigars    Start date: 2019   Smokeless tobacco: Never  Substance Use Topics   Alcohol use: Yes    Comment: occ    Family History  Problem Relation Age of Onset   Diabetes Maternal Grandmother    Hyperlipidemia Maternal Grandmother    Emphysema Mother    Diabetes Brother    Hyperlipidemia Maternal Aunt    Diabetes Maternal Aunt    Diabetes Paternal Grandmother       Review of Systems  Constitutional: negative for fatigue and weight loss Respiratory: negative for cough and wheezing Cardiovascular: negative for chest pain, fatigue and palpitations Gastrointestinal: negative for abdominal pain and change in bowel habits Musculoskeletal:negative for myalgias Neurological: negative for gait problems and tremors Behavioral/Psych: negative for abusive relationship,  depression Endocrine: negative for temperature intolerance    Genitourinary: positive for vaginal discharge, pelvic pain and irregular periods.  negative for genital lesions, hot flashes, sexual problems  Integument/breast: negative for breast lump, breast tenderness, nipple discharge and skin lesion(s)    Objective:       BP 118/77   Pulse 73   Ht 5\' 1"  (1.549 m)   Wt 105 lb 14.4 oz (48 kg)   LMP 12/30/2022   BMI 20.01 kg/m  General:   Alert and no distress  Skin:   no rash or abnormalities  Lungs:   clear to auscultation bilaterally  Heart:   regular rate and rhythm, S1, S2 normal, no murmur, click, rub or gallop  Breasts:   normal without suspicious masses, skin or nipple changes or axillary nodes  Abdomen:  normal findings: no organomegaly, soft, non-tender and no hernia  Pelvis:  External genitalia: normal general appearance Urinary system: urethral meatus normal and bladder without fullness, nontender Vaginal: normal without tenderness, induration or masses Cervix: normal appearance Adnexa: normal bimanual exam Uterus: anteverted and non-tender, normal size   Lab Review Urine pregnancy test Labs reviewed yes Radiologic studies reviewed no  I have spent a total of 20 minutes of face-to-face time, excluding clinical staff time, reviewing notes and preparing to see patient, ordering tests and/or medications, and counseling the patient.   Assessment:    1. Encounter for gynecological examination with Papanicolaou smear of cervix Rx: - Cytology - PAP( [place])  2. Encounter for other general counseling or advice on contraception - wants to remove Nexplanon and start OCP's  3. Vitamin deficiency Rx: - Prenat-Fe Poly-Methfol-FA-DHA (VITAFOL ULTRA) 29-0.6-0.4-200 MG CAPS; Take 1 capsule by mouth daily before breakfast.  Dispense: 90 capsule; Refill: 4  4. Encounter for initial prescription of contraceptive pills Rx: - levonorgestrel-ethinyl estradiol (NORDETTE)  0.15-30 MG-MCG tablet; Take 1 tablet by mouth daily.  Dispense: 28 tablet; Refill: 11 - POCT urine pregnancy  5. Severe dysmenorrhea Rx: - ibuprofen (ADVIL) 800 MG tablet; Take 1 tablet (800 mg total) by mouth every 8 (eight) hours as needed.  Dispense: 30 tablet; Refill: 5  6. Pelvic pain Rx: - US PELVIC COMPLETE WITH TRANSVAGINAL; Future     Plan:    Education reviewed: calcium supplements, depression evaluation, low fat, low cholesterol diet, safe sex/STD prevention, self breast exams, and weight bearing exercise. Contraception: OCP (estrogen/progesterone). Follow up in: 2 weeks.   Meds ordered this encounter  Medications   Prenat-Fe Poly-Methfol-FA-DHA (VITAFOL ULTRA) 29-0.6-0.4-200 MG CAPS    Sig: Take 1 capsule by mouth daily before breakfast.    Dispense:  90 capsule    Refill:  4   levonorgestrel-ethinyl estradiol (NORDETTE) 0.15-30 MG-MCG tablet    Sig: Take 1 tablet by mouth daily.    Dispense:  28 tablet    Refill:  11   ibuprofen (ADVIL) 800 MG tablet  Sig: Take 1 tablet (800 mg total) by mouth every 8 (eight) hours as needed.    Dispense:  30 tablet    Refill:  5   Orders Placed This Encounter  Procedures   US PELVIC COMPLETE WITH TRANSVAGINAL    Standing Status:   Future    Standing Expiration Date:   02/24/2024    Order Specific Question:   Reason for Exam (SYMPTOM  OR DIAGNOSIS REQUIRED)    Answer:   Irregular periods.  Severe dysmenorrhea.    Order Specific Question:   Preferred imaging location?    Answer:   [facility] Ultrasound   POCT urine pregnancy    Brock BadHarper, Devean Skoczylas A, MD 02/25/2023 5:26 PM

## 2023-02-24 NOTE — Progress Notes (Unsigned)
New Patient is in the office for pelvic pain and vaginal issues Has nexplanon that has been in place since 2020 Reports irregular cycles every couple of months lasting over a week with intermittent heavy to light bleeding LMP 12/30/2022 Reports cramps 7-8/10, sometimes outside of menstrual cycle Seen at ED on 02/17/2023  Currently taking Metronidazole for +BV Pt reports inguinal lymph nodes have also been swollen

## 2023-03-01 LAB — CYTOLOGY - PAP
Adequacy: ABSENT
Diagnosis: NEGATIVE
Diagnosis: REACTIVE

## 2023-03-04 ENCOUNTER — Ambulatory Visit (HOSPITAL_COMMUNITY): Admission: RE | Admit: 2023-03-04 | Payer: Medicaid Other | Source: Ambulatory Visit

## 2023-03-26 ENCOUNTER — Ambulatory Visit: Payer: Medicaid Other | Admitting: Obstetrics and Gynecology

## 2023-11-24 ENCOUNTER — Ambulatory Visit: Payer: Medicaid Other | Admitting: Obstetrics and Gynecology

## 2023-11-24 VITALS — BP 131/82 | HR 101 | Wt 97.0 lb

## 2023-11-24 DIAGNOSIS — Z3046 Encounter for surveillance of implantable subdermal contraceptive: Secondary | ICD-10-CM | POA: Diagnosis not present

## 2023-11-24 NOTE — Progress Notes (Signed)
     GYNECOLOGY OFFICE PROCEDURE NOTE  Carolyn Vaughn is a 23 y.o. G2P1011 here for Nexplanon  removal.  Last pap smear was on 4/1p/24 and was normal.  No other gynecologic concerns.    Nexplanon  Removal Patient identified, informed consent performed, consent signed.   Appropriate time out taken. Nexplanon  site identified.  Area prepped in usual sterile fashon.  Of note the previous placement of the nexplanon  was abnormal.  The device had been placed more toward the upper arm in a curvilinear fashion.   One ml of 1% lidocaine  was used to anesthetize the area at the distal end of the implant. A small stab incision was made right beside the implant on the distal portion.  The Nexplanon  rod was grasped using hemostats and removed without difficulty.  There was minimal blood loss. There were no complications.  3 ml of 1% lidocaine  was injected around the incision for post-procedure analgesia.  Steri-strips were applied over the small incision.  A pressure bandage was applied to reduce any bruising.  The patient tolerated the procedure well and was given post procedure instructions.  Patient is planning to use abstinence for contraception.   Jerilynn Buddle, MD, FACOG Obstetrician & Gynecologist, [facility], [facility]

## 2023-11-24 NOTE — Progress Notes (Signed)
 Pt is in office for Nexplanon removal. Pt would like no BC at this time. Pt would like to discuss sexual symptoms - low libido, dryness.

## 2023-11-25 ENCOUNTER — Encounter: Payer: Self-pay | Admitting: Physician Assistant

## 2023-12-02 ENCOUNTER — Ambulatory Visit (INDEPENDENT_AMBULATORY_CARE_PROVIDER_SITE_OTHER): Payer: Medicaid Other | Admitting: Physician Assistant

## 2023-12-02 ENCOUNTER — Encounter: Payer: Self-pay | Admitting: Physician Assistant

## 2023-12-02 VITALS — BP 90/58 | Ht 62.0 in | Wt 93.0 lb

## 2023-12-02 DIAGNOSIS — K219 Gastro-esophageal reflux disease without esophagitis: Secondary | ICD-10-CM | POA: Diagnosis not present

## 2023-12-02 MED ORDER — OMEPRAZOLE 20 MG PO CPDR: 20.0000 mg | DELAYED_RELEASE_CAPSULE | Freq: Every day | ORAL | 3 refills | Status: DC

## 2023-12-02 NOTE — Patient Instructions (Signed)
We have sent the following medications to your pharmacy for you to pick up at your convenience: Omeprazole 20 mg daily 30-60 minutes prior to breakfast.   _______________________________________________________  If your blood pressure at your visit was 140/90 or greater, please contact your primary care physician to follow up on this.  _______________________________________________________  If you are age 25 or older, your body mass index should be between 23-30. Your Body mass index is 17.01 kg/m. If this is out of the aforementioned range listed, please consider follow up with your Primary Care Provider.  If you are age 65 or younger, your body mass index should be between 19-25. Your Body mass index is 17.01 kg/m. If this is out of the aformentioned range listed, please consider follow up with your Primary Care Provider.   ________________________________________________________  The [facility] providers would like to encourage you to use [facility] to communicate with providers for non-urgent requests or questions.  Due to long hold times on the telephone, sending your provider a message by [facility] may be a faster and more efficient way to get a response.  Please allow 48 business hours for a response.  Please remember that this is for non-urgent requests.  _______________________________________________________

## 2023-12-02 NOTE — Progress Notes (Signed)
Chief Complaint: GERD  HPI:    Carolyn Vaughn is a 23 year old African-American female with past medical history as listed below, who was referred to me by Norm Salt, PA for a complaint of GERD.      11/24/23 patient seen by gynecologist for Nexplanon removal.    Today, patient presents to clinic accompanied by her 18-year-old non-verbal daughter, she explains that over the past year she has noticed an increase in reflux symptoms and tells me typically in the morning she has trouble noting a lot of acid reflux and some epigastric discomfort.  This typically starts around 6 or 7 in the morning.  She has been trying over-the-counter chewable Pepto/Tums tablets which sometimes help a little bit.  Has never been on any other medication.  Does admit to a diet high in fried foods and eating late at night.  Tells me it has been hard for her to put on weight because she sometimes feels like she cannot eat in the morning.  Occasionally feels like there is something in her throat.  Does describe at least 3-4 bowel movements a day and sometimes in the morning multiples of them at a time, thinks it may be related to what she ate the night before.    Denies fever, chills, vomiting or symptoms that awaken her from sleep.  Past Medical History:  Diagnosis Date   Asthma    Panic attack     Past Surgical History:  Procedure Laterality Date   NO PAST SURGERIES      Current Outpatient Medications  Medication Sig Dispense Refill   clindamycin (CLEOCIN) 300 MG capsule Take 1 capsule (300 mg total) by mouth 3 (three) times daily. (Patient not taking: Reported on 11/24/2023) 30 capsule 0   dicyclomine (BENTYL) 20 MG tablet Take 1 tablet (20 mg total) by mouth 2 (two) times daily as needed for spasms. (Patient not taking: Reported on 11/24/2023) 20 tablet 0   fluconazole (DIFLUCAN) 150 MG tablet Take 1 tablet (150 mg total) by mouth every 3 (three) days. 2 tablet 0   levonorgestrel-ethinyl estradiol  (NORDETTE) 0.15-30 MG-MCG tablet Take 1 tablet by mouth daily. 28 tablet 11   omeprazole (PRILOSEC) 20 MG capsule Take 1 capsule (20 mg total) by mouth daily. 30 capsule 0   ondansetron (ZOFRAN) 4 MG tablet Take 1 tablet (4 mg total) by mouth every 6 (six) hours. (Patient not taking: Reported on 11/24/2023) 12 tablet 0   Prenat-Fe Poly-Methfol-FA-DHA (VITAFOL ULTRA) 29-0.6-0.4-200 MG CAPS Take 1 capsule by mouth daily before breakfast. (Patient not taking: Reported on 11/24/2023) 90 capsule 4   No current facility-administered medications for this visit.    Allergies as of 12/02/2023   (No Known Allergies)    Family History  Problem Relation Age of Onset   Diabetes Maternal Grandmother    Hyperlipidemia Maternal Grandmother    Emphysema Mother    Diabetes Brother    Hyperlipidemia Maternal Aunt    Diabetes Maternal Aunt    Diabetes Paternal Grandmother     Social History   Socioeconomic History   Marital status: Single    Spouse name: Not on file   Number of children: Not on file   Years of education: Not on file   Highest education level: Not on file  Occupational History   Not on file  Tobacco Use   Smoking status: Former    Types: Cigars    Start date: 2019   Smokeless tobacco: Never  Vaping Use  Vaping status: Every Day   Substances: Nicotine, Flavoring  Substance and Sexual Activity   Alcohol use: Yes    Comment: occ   Drug use: No   Sexual activity: Yes    Birth control/protection: None  Other Topics Concern   Not on file  Social History Narrative   Not on file   Social Drivers of Health   Financial Resource Strain: Not on file  Food Insecurity: Not on file  Transportation Needs: Not on file  Physical Activity: Not on file  Stress: Not on file  Social Connections: Not on file  Intimate Partner Violence: Not on file    Review of Systems:    Constitutional: No fever or chills Skin: No rash Cardiovascular: No chest pain Respiratory: No SOB   Gastrointestinal: See HPI and otherwise negative Genitourinary: No dysuria  Neurological: No headache, dizziness or syncope Musculoskeletal: No new muscle or joint pain Hematologic: No bleeding  Psychiatric: No history of depression or anxiety   Physical Exam:  Vital signs: BP (!) 90/58   Ht 5\' 2"  (1.575 m)   Wt 93 lb (42.2 kg)   BMI 17.01 kg/m    Constitutional:   Pleasant thin appearing AA female appears to be in NAD, Well developed, Well nourished, alert and cooperative Head:  Normocephalic and atraumatic. Eyes:   PEERL, EOMI. No icterus. Conjunctiva pink. Ears:  Normal auditory acuity. Neck:  Supple Throat: Oral cavity and pharynx without inflammation, swelling or lesion.  Respiratory: Respirations even and unlabored. Lungs clear to auscultation bilaterally.   No wheezes, crackles, or rhonchi.  Cardiovascular: Normal S1, S2. No MRG. Regular rate and rhythm. No peripheral edema, cyanosis or pallor.  Gastrointestinal:  Soft, nondistended, nontender. No rebound or guarding. Normal bowel sounds. No appreciable masses or hepatomegaly. Rectal:  Not performed.  Msk:  Symmetrical without gross deformities. Without edema, no deformity or joint abnormality.  Neurologic:  Alert and  oriented x4;  grossly normal neurologically.  Skin:   Dry and intact without significant lesions or rashes. Psychiatric: Demonstrates good judgement and reason without abnormal affect or behaviors.  RELEVANT LABS AND IMAGING: CBC    Component Value Date/Time   WBC 6.6 11/02/2022 0330   RBC 4.64 11/02/2022 0330   HGB 14.4 11/02/2022 0330   HGB 11.9 04/26/2019 0847   HCT 42.3 11/02/2022 0330   HCT 35.4 04/26/2019 0847   PLT 304 11/02/2022 0330   PLT 278 04/26/2019 0847   MCV 91.2 11/02/2022 0330   MCV 92 04/26/2019 0847   MCH 31.0 11/02/2022 0330   MCHC 34.0 11/02/2022 0330   RDW 12.5 11/02/2022 0330   RDW 13.2 04/26/2019 0847   LYMPHSABS 1.8 05/19/2019 1503   LYMPHSABS 1.6 02/08/2019 1539    MONOABS 0.7 05/19/2019 1503   EOSABS 0.0 05/19/2019 1503   EOSABS 0.0 02/08/2019 1539   BASOSABS 0.0 05/19/2019 1503   BASOSABS 0.0 02/08/2019 1539    CMP     Component Value Date/Time   NA 135 11/02/2022 0330   K 3.8 11/02/2022 0330   CL 106 11/02/2022 0330   CO2 20 (L) 11/02/2022 0330   GLUCOSE 90 11/02/2022 0330   BUN 13 11/02/2022 0330   CREATININE 0.63 11/02/2022 0330   CALCIUM 8.8 (L) 11/02/2022 0330   PROT 7.6 11/02/2022 0330   ALBUMIN 4.0 11/02/2022 0330   AST 18 11/02/2022 0330   ALT 20 11/02/2022 0330   ALKPHOS 55 11/02/2022 0330   BILITOT 1.6 (H) 11/02/2022 0330   GFRNONAA >60 11/02/2022  0330   GFRAA >60 07/05/2019 0110    Assessment: 1.  GERD: With acid reflux in the morning, does eat a lot of fried foods and drinks caffeine, has never been on any prescription medication for this; likely diet related gastritis/GERD  Plan: 1.  Started the patient on Omeprazole 20 mg every morning, 30-60 minutes for breakfast.  Prescribed #30 with 3 refills. 2.  Discussed with patient that if she does not feel any different after taking this daily for the next 1 to 2 weeks and she can message me through MyChart.  At that point recommend 40 mg daily and can increase from there.  May add a Pepcid at night as most of her symptoms are in the morning. 3.  Most importantly discussed diet and lifestyle modifications.  Provided her with a GERD handout. 4.  Patient to follow in clinic with me in 3 months.  Assigned to Dr. Tomasa Rand today.   Hyacinth Meeker, PA-C [facility] 12/02/2023, 9:45 AM  Cc: Norm Salt, PA

## 2023-12-08 NOTE — Progress Notes (Signed)
Agree with the assessment and plan as outlined by Jennifer Lemmon, PA-C. ? ?Carolyn Vaughn E. Carolyn Sherman, MD ? ?

## 2024-02-02 ENCOUNTER — Ambulatory Visit

## 2024-02-07 ENCOUNTER — Ambulatory Visit: Payer: Medicaid Other | Admitting: Physician Assistant

## 2024-04-12 ENCOUNTER — Ambulatory Visit

## 2024-04-14 ENCOUNTER — Ambulatory Visit

## 2024-04-14 ENCOUNTER — Other Ambulatory Visit: Payer: Self-pay

## 2024-04-14 ENCOUNTER — Ambulatory Visit
Admission: EM | Admit: 2024-04-14 | Discharge: 2024-04-14 | Disposition: A | Discharge status: Home / Self Care | Attending: Physician Assistant | Admitting: Physician Assistant

## 2024-04-14 DIAGNOSIS — N898 Other specified noninflammatory disorders of vagina: Secondary | ICD-10-CM | POA: Diagnosis present

## 2024-04-14 DIAGNOSIS — Z113 Encounter for screening for infections with a predominantly sexual mode of transmission: Secondary | ICD-10-CM | POA: Insufficient documentation

## 2024-04-14 DIAGNOSIS — R102 Pelvic and perineal pain: Secondary | ICD-10-CM | POA: Insufficient documentation

## 2024-04-14 LAB — POCT URINALYSIS DIP (MANUAL ENTRY)
Bilirubin, UA: NEGATIVE
Blood, UA: NEGATIVE
Glucose, UA: NEGATIVE mg/dL
Ketones, POC UA: NEGATIVE mg/dL
Leukocytes, UA: NEGATIVE
Nitrite, UA: NEGATIVE
Protein Ur, POC: 100 mg/dL — AB
Spec Grav, UA: >=1.03 — AB (ref 1.010–1.025)
Urobilinogen, UA: 1 U/dL
pH, UA: 6.5 (ref 5.0–8.0)

## 2024-04-14 LAB — POCT URINE PREGNANCY: Preg Test, Ur: NEGATIVE

## 2024-04-14 MED ORDER — METRONIDAZOLE 500 MG PO TABS: 500.0000 mg | ORAL_TABLET | Freq: Two times a day (BID) | ORAL | 0 refills | Status: DC

## 2024-04-14 NOTE — ED Triage Notes (Signed)
 Pt presents with complaints of vaginal odor and dryness x 2 days. Pt states she did have unprotected sex recently and would like full STD panel. This includes blood work and vaginal swab. Pt currently denies pain & denies taking medications for symptoms reported.

## 2024-04-14 NOTE — Discharge Instructions (Addendum)
 Your urine pregnancy testing was negative and your urine dip was not concerning for a UTI.  You were seen today for STD screening.  We collected a cervicovaginal swab that we will assess for gonorrhea, chlamydia, trichomonas, bacterial vaginosis, yeast.  If collected blood work that we will assess for HIV and syphilis.  We will keep you updated with these results once they are available.  If any medications are indicated by those test results we will call you and medications will either be sent to the pharmacy on file or you can return to the urgent care for an injection.  It is recommended that you refrain from sexual activity until your test results are negative or until you have completed an appropriate medication regimen as dictated by your test results.  Please use a condom or another barrier method to help prevent STD transmission.  Please make sure that you communicate with your partners regarding your test results should any positive results, about as they will also need to be tested and screened.  Based on your symptoms I suspect you likely have bacterial vaginosis.  I have sent in a medication called metronidazole  to be taken by mouth twice per day for 7 days.  Please do not consume alcohol while taking this as this can cause a severe nausea and vomiting reaction.

## 2024-04-14 NOTE — ED Provider Notes (Signed)
 [facility]    CSN: [id number] Arrival date & time: 04/14/24  1910      History   Chief Complaint Chief Complaint  Patient presents with   Vaginal Odor     HPI Carolyn Vaughn is a 23 y.o. female.   HPI Pt reports concerns for vulvovaginal discomfort and concerns for potential STD  She reports having symptoms for the past 2 days  She reports having unprotected sex recently and would like STD testing as well  She states she is having vaginal odor and discharge changes She reports having some mild cramping in suprapubic area but thinks her period is about to start- reports this has resolved as of apt  She reports previous hx of BV in the past and she was seen by OB/GYN for this  Past Medical History:  Diagnosis Date   Asthma    Panic attack     Patient Active Problem List   Diagnosis Date Noted   Gestational hypertension, third trimester 07/05/2019   Chlamydia infection during pregnancy 04/26/2019   Supervision of other normal pregnancy, antepartum 02/08/2019    Past Surgical History:  Procedure Laterality Date   NO PAST SURGERIES      OB History     Gravida  2   Para  1   Term  1   Preterm      AB  1   Living  1      SAB      IAB  1   Ectopic      Multiple  0   Live Births  1            Home Medications    Prior to Admission medications   Medication Sig Start Date End Date Taking? Authorizing Provider  metroNIDAZOLE  (FLAGYL ) 500 MG tablet Take 1 tablet (500 mg total) by mouth 2 (two) times daily. 04/14/24  Yes Sherree Shankman E, PA-C  pantoprazole (PROTONIX) 40 MG tablet Take 40 mg by mouth daily. 02/15/24  Yes [provider]  dicyclomine  (BENTYL ) 20 MG tablet Take 1 tablet (20 mg total) by mouth 2 (two) times daily as needed for spasms. Patient not taking: Reported on 02/24/2023 11/02/22   Volney Grumbles, PA-C  ondansetron  (ZOFRAN ) 4 MG tablet Take 1 tablet (4 mg total) by mouth every 6 (six) hours.  11/02/22   Volney Grumbles, PA-C    Family History Family History  Problem Relation Age of Onset   Diabetes Maternal Grandmother    Hyperlipidemia Maternal Grandmother    Emphysema Mother    Diabetes Brother    Hyperlipidemia Maternal Aunt    Diabetes Maternal Aunt    Diabetes Paternal Grandmother     Social History Social History   Tobacco Use   Smoking status: Former    Types: Cigars    Start date: 2019   Smokeless tobacco: Never  Vaping Use   Vaping status: Every Day   Substances: Nicotine, Flavoring  Substance Use Topics   Alcohol use: Yes    Comment: occ   Drug use: No     Allergies   Azithromycin    Review of Systems Review of Systems  Constitutional:  Negative for chills and fever.  Gastrointestinal:  Negative for abdominal pain, diarrhea, nausea and vomiting.  Genitourinary:  Positive for vaginal discharge (she reports vaginal discharge is clumpy and thicker). Negative for difficulty urinating, dysuria, flank pain, vaginal bleeding and vaginal pain.     Physical Exam Triage Vital Signs  ED Triage Vitals  Encounter Vitals Group     BP 04/14/24 1931 117/75     Systolic BP Percentile --      Diastolic BP Percentile --      Pulse Rate 04/14/24 1931 100     Resp 04/14/24 1931 18     Temp 04/14/24 1931 97.7 F (36.5 C)     Temp Source 04/14/24 1931 Oral     SpO2 04/14/24 1931 99 %     Weight 04/14/24 1927 100 lb (45.4 kg)     Height 04/14/24 1927 5\' 2"  (1.575 m)     Head Circumference --      Peak Flow --      Pain Score 04/14/24 1927 0     Pain Loc --      Pain Education --      Exclude from Growth Chart --    No data found.  Updated Vital Signs BP 117/75 (BP Location: Right Arm)   Pulse 100   Temp 97.7 F (36.5 C) (Oral)   Resp 18   Ht 5\' 2"  (1.575 m)   Wt 100 lb (45.4 kg)   LMP  (Exact Date)   SpO2 99%   Breastfeeding No   BMI 18.29 kg/m   Visual Acuity Right Eye Distance:   Left Eye Distance:   Bilateral Distance:     Right Eye Near:   Left Eye Near:    Bilateral Near:     Physical Exam Vitals reviewed.  Constitutional:      General: She is awake. She is not in acute distress.    Appearance: Normal appearance. She is well-developed and well-groomed. She is not ill-appearing or toxic-appearing.  HENT:     Head: Normocephalic and atraumatic.  Eyes:     General: Lids are normal. Gaze aligned appropriately.     Extraocular Movements: Extraocular movements intact.     Conjunctiva/sclera: Conjunctivae normal.  Pulmonary:     Effort: Pulmonary effort is normal.  Neurological:     General: No focal deficit present.     Mental Status: She is alert and oriented to person, place, and time.     GCS: GCS eye subscore is 4. GCS verbal subscore is 5. GCS motor subscore is 6.     Cranial Nerves: No cranial nerve deficit, dysarthria or facial asymmetry.  Psychiatric:        Attention and Perception: Attention and perception normal.        Mood and Affect: Mood and affect normal.        Speech: Speech normal.        Behavior: Behavior normal. Behavior is cooperative.      UC Treatments / Results  Labs (all labs ordered are listed, but only abnormal results are displayed) Labs Reviewed  POCT URINALYSIS DIP (MANUAL ENTRY) - Abnormal; Notable for the following components:      Result Value   Spec Grav, UA >=1.030 (*)    Protein Ur, POC =100 (*)    All other components within normal limits  RPR  HIV ANTIBODY (ROUTINE TESTING W REFLEX)  POCT URINE PREGNANCY  CERVICOVAGINAL ANCILLARY ONLY    EKG   Radiology No results found.  Procedures Procedures (including critical care time)  Medications Ordered in UC Medications - No data to display  Initial Impression / Assessment and Plan / UC Course  I have reviewed the triage vital signs and the nursing notes.  Pertinent labs & imaging results that were available during  my care of the patient were reviewed by me and considered in my medical  decision making (see chart for details).      Final Clinical Impressions(s) / UC Diagnoses   Final diagnoses:  Screening examination for STD (sexually transmitted disease)  Vulvovaginal discomfort  Vaginal discharge   Patient presents today with concerns for vulvovaginal discomfort, odor and changes to vaginal discharge.  She reports similar symptoms in the past when she was diagnosed with bacterial vaginosis.  She also reports concerns for potential STD infection after unprotected sex and would like STD testing.  Given her symptoms I am suspicious for bacterial vaginosis so we will send in metronidazole  500 mg p.o. twice daily x 7 days for management.  Will get cervicovaginal swab for confirmation as well as testing for yeast, trichomoniasis, gonorrhea, chlamydia.  Patient would also like blood work for HIV and syphilis testing.  This was also collected today.  Results to dictate further management.  Urine dip was negative for signs of UTI or hematuria.  Urine pregnancy was negative.  Reviewed safe sex practices with patient as well as avoiding sexual activity until test results are back and are negative or she has completed an appropriate regimen.  Follow-up as needed or indicated by test results.    Discharge Instructions      Your urine pregnancy testing was negative and your urine dip was not concerning for a UTI.  You were seen today for STD screening.  We collected a cervicovaginal swab that we will assess for gonorrhea, chlamydia, trichomonas, bacterial vaginosis, yeast.  If collected blood work that we will assess for HIV and syphilis.  We will keep you updated with these results once they are available.  If any medications are indicated by those test results we will call you and medications will either be sent to the pharmacy on file or you can return to the urgent care for an injection.  It is recommended that you refrain from sexual activity until your test results are negative or  until you have completed an appropriate medication regimen as dictated by your test results.  Please use a condom or another barrier method to help prevent STD transmission.  Please make sure that you communicate with your partners regarding your test results should any positive results, about as they will also need to be tested and screened.    ED Prescriptions     Medication Sig Dispense Auth. Provider   metroNIDAZOLE  (FLAGYL ) 500 MG tablet Take 1 tablet (500 mg total) by mouth 2 (two) times daily. 14 tablet Janani Chamber E, PA-C      PDMP not reviewed this encounter.   Jerona Mooring, PA-C 04/14/24 2019

## 2024-04-17 LAB — CERVICOVAGINAL ANCILLARY ONLY
Bacterial Vaginitis (gardnerella): POSITIVE — AB
Candida Glabrata: NEGATIVE
Candida Vaginitis: POSITIVE — AB
Chlamydia: NEGATIVE
Comment: NEGATIVE
Comment: NEGATIVE
Comment: NEGATIVE
Comment: NEGATIVE
Comment: NEGATIVE
Comment: NORMAL
Neisseria Gonorrhea: NEGATIVE
Trichomonas: NEGATIVE

## 2024-04-18 ENCOUNTER — Ambulatory Visit (HOSPITAL_COMMUNITY): Payer: Self-pay

## 2024-04-18 LAB — RPR: RPR Ser Ql: NONREACTIVE

## 2024-04-18 LAB — HIV ANTIBODY (ROUTINE TESTING W REFLEX): HIV Screen 4th Generation wRfx: NONREACTIVE

## 2024-04-18 MED ORDER — FLUCONAZOLE 150 MG PO TABS: 150.0000 mg | ORAL_TABLET | Freq: Once | ORAL | 0 refills | Status: AC

## 2024-09-08 ENCOUNTER — Ambulatory Visit

## 2024-09-12 ENCOUNTER — Ambulatory Visit

## 2024-09-14 ENCOUNTER — Telehealth: Payer: Self-pay | Admitting: Emergency Medicine

## 2024-09-14 ENCOUNTER — Ambulatory Visit
Admission: RE | Admit: 2024-09-14 | Discharge: 2024-09-14 | Disposition: A | Discharge status: Home / Self Care | Source: Ambulatory Visit | Attending: Internal Medicine | Admitting: Internal Medicine

## 2024-09-14 VITALS — BP 113/65 | HR 100 | Temp 98.4°F | Resp 18

## 2024-09-14 DIAGNOSIS — N760 Acute vaginitis: Secondary | ICD-10-CM | POA: Insufficient documentation

## 2024-09-14 DIAGNOSIS — Z3202 Encounter for pregnancy test, result negative: Secondary | ICD-10-CM | POA: Diagnosis not present

## 2024-09-14 DIAGNOSIS — N898 Other specified noninflammatory disorders of vagina: Secondary | ICD-10-CM | POA: Insufficient documentation

## 2024-09-14 DIAGNOSIS — Z9189 Other specified personal risk factors, not elsewhere classified: Secondary | ICD-10-CM | POA: Diagnosis not present

## 2024-09-14 LAB — POCT URINE PREGNANCY: Preg Test, Ur: NEGATIVE

## 2024-09-14 MED ORDER — METRONIDAZOLE 500 MG PO TABS: 500.0000 mg | ORAL_TABLET | Freq: Two times a day (BID) | ORAL | 0 refills | Status: DC

## 2024-09-14 MED ORDER — METRONIDAZOLE 500 MG PO TABS
500.0000 mg | ORAL_TABLET | Freq: Two times a day (BID) | ORAL | 0 refills | Status: AC
Start: 2024-09-14 — End: ?

## 2024-09-14 NOTE — ED Provider Notes (Signed)
 [facility]    CSN: [id number] Arrival date & time: 09/14/24  1835      History   Chief Complaint Chief Complaint  Patient presents with   SEXUALLY TRANSMITTED DISEASE    Entered by patient    HPI Carolyn Vaughn is a 23 y.o. female.   Carolyn Vaughn is a 23 y.o. female presenting for chief complaint of vaginal discharge and vaginal odor that started 1 week ago. Discharge is white and slightly clumpy but not like cottage cheese per patient. Vaginal odor has fishy smell. History of BV, states this feels very similar. 1 female partner within the last month unprotected. She's unsure of any exposures to STD. History of frequent BV infections, she is unsure of what is triggering this. Uses sensitive skin soap and has not changed laundry detergents recently. Denies vaginal itching, urinary symptoms, low back pain, pelvic pain/abdominal pain, fever/chills, and nausea/vomiting.      Past Medical History:  Diagnosis Date   Asthma    Panic attack     Patient Active Problem List   Diagnosis Date Noted   Gestational hypertension, third trimester 07/05/2019   Chlamydia infection during pregnancy 04/26/2019   Supervision of other normal pregnancy, antepartum 02/08/2019    Past Surgical History:  Procedure Laterality Date   NO PAST SURGERIES      OB History     Gravida  2   Para  1   Term  1   Preterm      AB  1   Living  1      SAB      IAB  1   Ectopic      Multiple  0   Live Births  1            Home Medications    Prior to Admission medications   Medication Sig Start Date End Date Taking? Authorizing Provider  dicyclomine  (BENTYL ) 20 MG tablet Take 1 tablet (20 mg total) by mouth 2 (two) times daily as needed for spasms. Patient not taking: Reported on 02/24/2023 11/02/22   Waylan Elsie PARAS, PA-C  metroNIDAZOLE  (FLAGYL ) 500 MG tablet Take 1 tablet (500 mg total) by mouth 2 (two) times daily. 09/14/24    Enedelia Dorna HERO, FNP  ondansetron  (ZOFRAN ) 4 MG tablet Take 1 tablet (4 mg total) by mouth every 6 (six) hours. 11/02/22   Waylan Elsie PARAS, PA-C  pantoprazole (PROTONIX) 40 MG tablet Take 40 mg by mouth daily. 02/15/24   [provider]    Family History Family History  Problem Relation Age of Onset   Diabetes Maternal Grandmother    Hyperlipidemia Maternal Grandmother    Emphysema Mother    Diabetes Brother    Hyperlipidemia Maternal Aunt    Diabetes Maternal Aunt    Diabetes Paternal Grandmother     Social History Social History   Tobacco Use   Smoking status: Former    Types: Cigars    Start date: 2019   Smokeless tobacco: Never  Vaping Use   Vaping status: Every Day   Substances: Nicotine, Flavoring  Substance Use Topics   Alcohol use: Yes    Comment: occ   Drug use: No     Allergies   Azithromycin    Review of Systems Review of Systems Per HPI  Physical Exam Triage Vital Signs ED Triage Vitals  Encounter Vitals Group     BP 09/14/24 1910 113/65     Girls Systolic BP Percentile --  Girls Diastolic BP Percentile --      Boys Systolic BP Percentile --      Boys Diastolic BP Percentile --      Pulse Rate 09/14/24 1910 100     Resp 09/14/24 1910 18     Temp 09/14/24 1910 98.4 F (36.9 C)     Temp Source 09/14/24 1910 Oral     SpO2 09/14/24 1910 96 %     Weight --      Height --      Head Circumference --      Peak Flow --      Pain Score 09/14/24 1915 0     Pain Loc --      Pain Education --      Exclude from Growth Chart --    No data found.  Updated Vital Signs BP 113/65 (BP Location: Right Arm)   Pulse 100   Temp 98.4 F (36.9 C) (Oral)   Resp 18   LMP 07/31/2024 (Approximate)   SpO2 96%   Visual Acuity Right Eye Distance:   Left Eye Distance:   Bilateral Distance:    Right Eye Near:   Left Eye Near:    Bilateral Near:     Physical Exam Vitals and nursing note reviewed.  Constitutional:      Appearance:  She is not ill-appearing or toxic-appearing.  HENT:     Head: Normocephalic and atraumatic.     Right Ear: Hearing and external ear normal.     Left Ear: Hearing and external ear normal.     Nose: Nose normal.     Mouth/Throat:     Lips: Pink.  Eyes:     General: Lids are normal. Vision grossly intact. Gaze aligned appropriately.     Extraocular Movements: Extraocular movements intact.     Conjunctiva/sclera: Conjunctivae normal.  Pulmonary:     Effort: Pulmonary effort is normal.  Musculoskeletal:     Cervical back: Neck supple.  Skin:    General: Skin is warm and dry.     Capillary Refill: Capillary refill takes less than 2 seconds.     Findings: No rash.  Neurological:     General: No focal deficit present.     Mental Status: She is alert and oriented to person, place, and time. Mental status is at baseline.     Cranial Nerves: No dysarthria or facial asymmetry.  Psychiatric:        Mood and Affect: Mood normal.        Speech: Speech normal.        Behavior: Behavior normal.        Thought Content: Thought content normal.        Judgment: Judgment normal.      UC Treatments / Results  Labs (all labs ordered are listed, but only abnormal results are displayed) Labs Reviewed  RPR  HIV ANTIBODY (ROUTINE TESTING W REFLEX)  POCT URINE PREGNANCY  CERVICOVAGINAL ANCILLARY ONLY    EKG   Radiology No results found.  Procedures Procedures (including critical care time)  Medications Ordered in UC Medications - No data to display  Initial Impression / Assessment and Plan / UC Course  I have reviewed the triage vital signs and the nursing notes.  Pertinent labs & imaging results that were available during my care of the patient were reviewed by me and considered in my medical decision making (see chart for details).   1. At risk for STD due to unprotected sex,  acute vaginitis, vaginal odor, negative pregnancy test High clinical suspicion for bacterial vaginosis,  therefore will treat with flagyl  empirically.  Discussed risks of disulfiram reaction, advised cessation of alcohol use during and for 72 hours after use of flagyl .  Vaginal swab pending, will treat for other positive infections per protocol when labs result. Discussed tips to prevent recurrent BV infections. Safe sex precautions discussed.    Urine pregnancy test is negative.   Counseled patient on potential for adverse effects with medications prescribed/recommended today, strict ER and return-to-clinic precautions discussed, patient verbalized understanding.    Final Clinical Impressions(s) / UC Diagnoses   Final diagnoses:  At risk for sexually transmitted disease due to unprotected sex  Acute vaginitis  Vaginal odor  Urine pregnancy test negative     Discharge Instructions      Your evaluation suggests that you likely have bacterial vaginitis.  Your vaginal testing is pending and will come back in the next 2-3 days. You will receive a phone call from staff if you test positive for any other infections. We will go ahead and start treatment for BV. Take antibiotic as prescribed to treat this. If you were given flagyl  pills, do not drink alcohol within 72 hours of taking this medication as this can cause severe nausea and vomiting.   Follow-up with OB/GYN or return to urgent care as needed.       ED Prescriptions     Medication Sig Dispense Auth. Provider   metroNIDAZOLE  (FLAGYL ) 500 MG tablet Take 1 tablet (500 mg total) by mouth 2 (two) times daily. 14 tablet Enedelia Dorna HERO, FNP      PDMP not reviewed this encounter.   Enedelia Dorna HERO, FNP 09/14/24 2000

## 2024-09-14 NOTE — ED Triage Notes (Signed)
 Pt presents for STD testing with blood work.   States she gets BV regular  and would like to be tested for that. She does have some clumpy discharge for a few days.

## 2024-09-14 NOTE — Discharge Instructions (Signed)
 Your evaluation suggests that you likely have bacterial vaginitis.  Your vaginal testing is pending and will come back in the next 2-3 days. You will receive a phone call from staff if you test positive for any other infections. We will go ahead and start treatment for BV. Take antibiotic as prescribed to treat this. If you were given flagyl  pills, do not drink alcohol within 72 hours of taking this medication as this can cause severe nausea and vomiting.   Follow-up with OB/GYN or return to urgent care as needed.

## 2024-09-14 NOTE — Telephone Encounter (Signed)
 Pt wanted pharmacy changed to [address]. Pharmacy changed and pt aware

## 2024-09-15 LAB — CERVICOVAGINAL ANCILLARY ONLY
Bacterial Vaginitis (gardnerella): POSITIVE — AB
Candida Glabrata: NEGATIVE
Candida Vaginitis: NEGATIVE
Chlamydia: NEGATIVE
Comment: NEGATIVE
Comment: NEGATIVE
Comment: NEGATIVE
Comment: NEGATIVE
Comment: NEGATIVE
Comment: NORMAL
Neisseria Gonorrhea: NEGATIVE
Trichomonas: NEGATIVE

## 2024-09-15 LAB — HIV ANTIBODY (ROUTINE TESTING W REFLEX): HIV Screen 4th Generation wRfx: NONREACTIVE

## 2024-09-15 LAB — RPR: RPR Ser Ql: NONREACTIVE

## 2024-09-18 ENCOUNTER — Ambulatory Visit (HOSPITAL_COMMUNITY): Payer: Self-pay

## 2024-11-01 ENCOUNTER — Inpatient Hospital Stay (HOSPITAL_COMMUNITY)

## 2024-11-01 ENCOUNTER — Inpatient Hospital Stay (HOSPITAL_COMMUNITY)
Admission: EM | Admit: 2024-11-01 | Discharge: 2024-11-01 | Disposition: A | Discharge status: Home / Self Care | Source: Home / Self Care | Admitting: Obstetrics and Gynecology

## 2024-11-01 ENCOUNTER — Encounter (HOSPITAL_COMMUNITY): Payer: Self-pay | Admitting: Emergency Medicine

## 2024-11-01 ENCOUNTER — Other Ambulatory Visit: Payer: Self-pay

## 2024-11-01 DIAGNOSIS — Z3A09 9 weeks gestation of pregnancy: Secondary | ICD-10-CM | POA: Diagnosis not present

## 2024-11-01 DIAGNOSIS — O99611 Diseases of the digestive system complicating pregnancy, first trimester: Secondary | ICD-10-CM | POA: Diagnosis not present

## 2024-11-01 DIAGNOSIS — N912 Amenorrhea, unspecified: Secondary | ICD-10-CM

## 2024-11-01 DIAGNOSIS — K219 Gastro-esophageal reflux disease without esophagitis: Secondary | ICD-10-CM | POA: Diagnosis not present

## 2024-11-01 DIAGNOSIS — O26891 Other specified pregnancy related conditions, first trimester: Secondary | ICD-10-CM

## 2024-11-01 DIAGNOSIS — R079 Chest pain, unspecified: Secondary | ICD-10-CM | POA: Insufficient documentation

## 2024-11-01 DIAGNOSIS — Z3201 Encounter for pregnancy test, result positive: Secondary | ICD-10-CM

## 2024-11-01 LAB — URINALYSIS, ROUTINE W REFLEX MICROSCOPIC
Bilirubin Urine: NEGATIVE
Glucose, UA: NEGATIVE mg/dL
Hgb urine dipstick: NEGATIVE
Ketones, ur: NEGATIVE mg/dL
Nitrite: NEGATIVE
Protein, ur: 30 mg/dL — AB
Specific Gravity, Urine: 1.017 (ref 1.005–1.030)
Squamous Epithelial / HPF: >50 /HPF (ref 0–5)
pH: 7 (ref 5.0–8.0)

## 2024-11-01 LAB — COMPREHENSIVE METABOLIC PANEL WITH GFR
ALT: 25 U/L (ref 0–44)
AST: 23 U/L (ref 15–41)
Albumin: 4.1 g/dL (ref 3.5–5.0)
Alkaline Phosphatase: 43 U/L (ref 38–126)
Anion gap: 9 (ref 5–15)
BUN: 6 mg/dL (ref 6–20)
CO2: 22 mmol/L (ref 22–32)
Calcium: 9.3 mg/dL (ref 8.9–10.3)
Chloride: 103 mmol/L (ref 98–111)
Creatinine, Ser: 0.53 mg/dL (ref 0.44–1.00)
GFR, Estimated: >60 mL/min (ref >60)
Glucose, Bld: 88 mg/dL (ref 70–99)
Potassium: 3.9 mmol/L (ref 3.5–5.1)
Sodium: 134 mmol/L — ABNORMAL LOW (ref 135–145)
Total Bilirubin: 0.6 mg/dL (ref 0.0–1.2)
Total Protein: 6.8 g/dL (ref 6.5–8.1)

## 2024-11-01 LAB — CBC WITH DIFFERENTIAL/PLATELET
Abs Immature Granulocytes: 0.02 K/uL (ref 0.00–0.07)
Basophils Absolute: 0 K/uL (ref 0.0–0.1)
Basophils Relative: 1 %
Eosinophils Absolute: 0 K/uL (ref 0.0–0.5)
Eosinophils Relative: 0 %
HCT: 37.1 % (ref 36.0–46.0)
Hemoglobin: 13.1 g/dL (ref 12.0–15.0)
Immature Granulocytes: 0 %
Lymphocytes Relative: 31 %
Lymphs Abs: 1.9 K/uL (ref 0.7–4.0)
MCH: 32 pg (ref 26.0–34.0)
MCHC: 35.3 g/dL (ref 30.0–36.0)
MCV: 90.7 fL (ref 80.0–100.0)
Monocytes Absolute: 0.4 K/uL (ref 0.1–1.0)
Monocytes Relative: 7 %
Neutro Abs: 3.8 K/uL (ref 1.7–7.7)
Neutrophils Relative %: 61 %
Platelets: 314 K/uL (ref 150–400)
RBC: 4.09 MIL/uL (ref 3.87–5.11)
RDW: 12.2 % (ref 11.5–15.5)
WBC: 6.3 K/uL (ref 4.0–10.5)
nRBC: 0 % (ref 0.0–0.2)

## 2024-11-01 LAB — TROPONIN T, HIGH SENSITIVITY: Troponin T High Sensitivity: <15 ng/L (ref 0–19)

## 2024-11-01 LAB — PRO BRAIN NATRIURETIC PEPTIDE: Pro Brain Natriuretic Peptide: 89 pg/mL (ref <300.0)

## 2024-11-01 LAB — POC URINE PREG, ED: Preg Test, Ur: POSITIVE — AB

## 2024-11-01 LAB — PREGNANCY, URINE: Preg Test, Ur: POSITIVE — AB

## 2024-11-01 LAB — D-DIMER, QUANTITATIVE: D-Dimer, Quant: 0.42 ug{FEU}/mL (ref 0.00–0.50)

## 2024-11-01 LAB — LIPASE, BLOOD: Lipase: 17 U/L (ref 11–51)

## 2024-11-01 MED ORDER — METOCLOPRAMIDE HCL 10 MG PO TABS: 10.0000 mg | ORAL_TABLET | Freq: Four times a day (QID) | ORAL | 0 refills | Status: AC

## 2024-11-01 MED ORDER — FAMOTIDINE 20 MG PO TABS
40.0000 mg | ORAL_TABLET | Freq: Once | ORAL | Status: AC
  Administered 2024-11-01: 17:00:00 40 mg via ORAL
  Filled 2024-11-01: qty 2

## 2024-11-01 NOTE — MAU Note (Signed)
.  Carolyn Vaughn is a 23 y.o. at Unknown here in MAU reporting: chest pain unsure if it is related to GERD.  Onset of complaint: today 1 hour ago  Pain score: 7/10 Vitals:   11/01/24 1312  BP: 114/64  Pulse: 88  Resp: 18  Temp: 98.2 F (36.8 C)  SpO2: 100%      Lab orders placed from triage:   none

## 2024-11-01 NOTE — ED Provider Triage Note (Signed)
 Emergency Medicine Provider Triage Evaluation Note  Carolyn Vaughn , a 23 y.o. female  was evaluated in triage.  Pt complains of nausea, vomiting and diarrhea.  Has had some abdominal cramping.  History of GERD.  She feels like her symptoms are worse.  Notes that her period is delayed.  Review of Systems  Positive:  Negative:   Physical Exam  BP 114/64 (BP Location: Right Arm)   Pulse 88   Temp 98.2 F (36.8 C)   Resp 18   Ht 5' 2 (1.575 m)   Wt 45.4 kg   LMP 08/30/2024   SpO2 100%   BMI 18.29 kg/m  Gen:   Awake, no distress   Resp:  Normal effort  MSK:   Moves extremities without difficulty  Other:  No focal abdominal tenderness  Medical Decision Making  Medically screening exam initiated at 1:42 PM.  Appropriate orders placed.  Carolyn DELENA Gilmer-Sinclair was informed that the remainder of the evaluation will be completed by another provider, this initial triage assessment does not replace that evaluation, and the importance of remaining in the ED until their evaluation is complete.     Donnajean Lynwood DEL, PA-C 11/01/24 1343

## 2024-11-01 NOTE — Discharge Instructions (Signed)

## 2024-11-01 NOTE — ED Notes (Signed)
 Urine POC Pregnancy test positive. Not crossing over into chart. Triage PA notified. KIT

## 2024-11-01 NOTE — ED Triage Notes (Signed)
 Patient states that she is having abdominal pain, nausea, vomiting and diarrhea. Denies fever. She has had these symptoms for 2 weeks and have been worsening since Monday. She is now feeling fatigued.

## 2024-11-01 NOTE — MAU Provider Note (Signed)
 None     [name] is a 23 y.o. [id number] pregnant female at [redacted]w[redacted]d who presents to MAU today with complaint of chest pain 9/10. Reports this is ongoing, has been told its GERD but is concerned.   Toward the end of the MAU visit patient stated to CNM that she has intermittent HA and nausea. Would prefer prescription sent to pharmacy.   Has received care at [facility] in the past, but not yet this pregnancy.   Pertinent items noted in HPI and remainder of comprehensive ROS otherwise negative.   O BP 127/64   Pulse 65   Temp 98.8 F (37.1 C)   Resp 16   Ht 5' 2 (1.575 m)   Wt 45.4 kg   LMP 08/30/2024   SpO2 100%   BMI 18.29 kg/m  Physical Exam Vitals reviewed.  Constitutional:      Appearance: Normal appearance.  HENT:     Head: Normocephalic.  Cardiovascular:     Rate and Rhythm: Normal rate and regular rhythm.     Pulses: Normal pulses.     Heart sounds: Normal heart sounds.  Pulmonary:     Effort: Pulmonary effort is normal.     Breath sounds: Normal breath sounds.  Skin:    General: Skin is warm and dry.     Capillary Refill: Capillary refill takes less than 2 seconds.  Neurological:     General: No focal deficit present.     Mental Status: She is alert and oriented to person, place, and time.      MDM:  Moderate  MAU Course:  A Amenorrhea - Plan: Discharge patient  Positive pregnancy test  Gastroesophageal reflux disease, unspecified whether esophagitis present  Medical screening exam complete  Evaluation of chest pain: EKG, and CXR imaging both benign. Labs: CMP, CBC (drawn in ED and WNL), Troponin, BNP and Ddimer. All benign.  Will treat for GERD with Pepcid  40mg  while labs pending. Improvement in heartburn symptoms after medication administered.   P Discharge from MAU in stable condition with routine. precautions Follow up at preferred provider, list provided to establish care.   Advised acetaminophen  safe in pregnancy for HA.   Reglan  sent for treatment of HA, GERD, nausea PRN. 5mg  q6h PO PRN. Zofran  as additional nausea medication 4mg  ODT q6h PRN.  Allergies as of 11/01/2024       Reactions   Azithromycin  Nausea And Vomiting        Medication List     STOP taking these medications    metroNIDAZOLE  500 MG tablet Commonly known as: FLAGYL    ondansetron  4 MG tablet Commonly known as: ZOFRAN        TAKE these medications    dicyclomine  20 MG tablet Commonly known as: BENTYL  Take 1 tablet (20 mg total) by mouth 2 (two) times daily as needed for spasms.   metoCLOPramide  10 MG tablet Commonly known as: REGLAN  Take 1 tablet (10 mg total) by mouth every 6 (six) hours.   ondansetron  4 MG disintegrating tablet Commonly known as: ZOFRAN -ODT Take 1 tablet (4 mg total) by mouth every 8 (eight) hours as needed for nausea.   pantoprazole 40 MG tablet Commonly known as: PROTONIX Take 40 mg by mouth daily.        Camie Rote, MSN, CNM 11/01/2024 5:46 PM  Certified Nurse Midwife, [facility]

## 2024-11-07 ENCOUNTER — Telehealth: Payer: Self-pay

## 2024-11-07 ENCOUNTER — Ambulatory Visit: Payer: Self-pay

## 2024-12-19 ENCOUNTER — Ambulatory Visit: Admitting: Gastroenterology
